# Patient Record
Sex: Female | Born: 1985 | Hispanic: No | Marital: Single | Smoking: Current every day smoker
Health system: Southern US, Community
[De-identification: ages and names within clinical notes are randomized; demographics above are authoritative.]

## PROBLEM LIST (undated history)

## (undated) DIAGNOSIS — K219 Gastro-esophageal reflux disease without esophagitis: Secondary | ICD-10-CM

## (undated) DIAGNOSIS — A599 Trichomoniasis, unspecified: Secondary | ICD-10-CM

## (undated) DIAGNOSIS — F319 Bipolar disorder, unspecified: Secondary | ICD-10-CM

## (undated) DIAGNOSIS — R87629 Unspecified abnormal cytological findings in specimens from vagina: Secondary | ICD-10-CM

## (undated) DIAGNOSIS — F419 Anxiety disorder, unspecified: Secondary | ICD-10-CM

## (undated) DIAGNOSIS — F32A Depression, unspecified: Secondary | ICD-10-CM

## (undated) DIAGNOSIS — G43909 Migraine, unspecified, not intractable, without status migrainosus: Secondary | ICD-10-CM

## (undated) DIAGNOSIS — A749 Chlamydial infection, unspecified: Secondary | ICD-10-CM

## (undated) DIAGNOSIS — U071 COVID-19: Secondary | ICD-10-CM

## (undated) HISTORY — PX: COLPOSCOPY: SHX161

## (undated) HISTORY — PX: TUBAL LIGATION: SHX77

---

## 2008-09-24 ENCOUNTER — Emergency Department (HOSPITAL_BASED_OUTPATIENT_CLINIC_OR_DEPARTMENT_OTHER): Admission: EM | Admit: 2008-09-24 | Discharge: 2008-09-24 | Payer: Self-pay | Admitting: Emergency Medicine

## 2008-12-31 ENCOUNTER — Emergency Department (HOSPITAL_BASED_OUTPATIENT_CLINIC_OR_DEPARTMENT_OTHER): Admission: EM | Admit: 2008-12-31 | Discharge: 2009-01-01 | Payer: Self-pay | Admitting: Emergency Medicine

## 2009-01-06 ENCOUNTER — Inpatient Hospital Stay (HOSPITAL_COMMUNITY): Admission: AD | Admit: 2009-01-06 | Discharge: 2009-01-06 | Payer: Self-pay | Admitting: Obstetrics & Gynecology

## 2010-04-24 LAB — URINALYSIS, ROUTINE W REFLEX MICROSCOPIC
Bilirubin Urine: NEGATIVE
Glucose, UA: NEGATIVE mg/dL
Hgb urine dipstick: NEGATIVE
Specific Gravity, Urine: 1.02 (ref 1.005–1.030)
pH: 6.5 (ref 5.0–8.0)

## 2010-04-25 LAB — CBC
MCV: 91.9 fL (ref 78.0–100.0)
Platelets: 235 10*3/uL (ref 150–400)
RBC: 3.53 MIL/uL — ABNORMAL LOW (ref 3.87–5.11)
WBC: 12.5 10*3/uL — ABNORMAL HIGH (ref 4.0–10.5)

## 2010-04-25 LAB — PREGNANCY, URINE: Preg Test, Ur: POSITIVE

## 2010-04-25 LAB — HCG, QUANTITATIVE, PREGNANCY: hCG, Beta Chain, Quant, S: 7431 m[IU]/mL — ABNORMAL HIGH (ref <5)

## 2010-04-25 LAB — DIFFERENTIAL
Eosinophils Absolute: 0.2 10*3/uL (ref 0.0–0.7)
Lymphocytes Relative: 27 % (ref 12–46)
Lymphs Abs: 3.4 10*3/uL (ref 0.7–4.0)
Monocytes Relative: 8 % (ref 3–12)
Neutrophils Relative %: 63 % (ref 43–77)

## 2010-04-25 LAB — WET PREP, GENITAL

## 2010-04-25 LAB — URINALYSIS, ROUTINE W REFLEX MICROSCOPIC
Glucose, UA: NEGATIVE mg/dL
Ketones, ur: NEGATIVE mg/dL
Nitrite: NEGATIVE
Protein, ur: NEGATIVE mg/dL
pH: 7 (ref 5.0–8.0)

## 2010-04-25 LAB — URINE MICROSCOPIC-ADD ON

## 2010-04-25 LAB — GC/CHLAMYDIA PROBE AMP, GENITAL: Chlamydia, DNA Probe: NEGATIVE

## 2010-04-28 LAB — RAPID STREP SCREEN (MED CTR MEBANE ONLY): Streptococcus, Group A Screen (Direct): NEGATIVE

## 2010-06-20 ENCOUNTER — Emergency Department (HOSPITAL_BASED_OUTPATIENT_CLINIC_OR_DEPARTMENT_OTHER)
Admission: EM | Admit: 2010-06-20 | Discharge: 2010-06-20 | Disposition: A | Discharge status: Home / Self Care | Payer: No Typology Code available for payment source | Attending: Emergency Medicine | Admitting: Emergency Medicine

## 2010-06-20 DIAGNOSIS — Y92410 Unspecified street and highway as the place of occurrence of the external cause: Secondary | ICD-10-CM | POA: Insufficient documentation

## 2010-06-20 DIAGNOSIS — M549 Dorsalgia, unspecified: Secondary | ICD-10-CM | POA: Insufficient documentation

## 2010-06-20 DIAGNOSIS — M542 Cervicalgia: Secondary | ICD-10-CM | POA: Insufficient documentation

## 2010-12-16 ENCOUNTER — Emergency Department (HOSPITAL_BASED_OUTPATIENT_CLINIC_OR_DEPARTMENT_OTHER)
Admission: EM | Admit: 2010-12-16 | Discharge: 2010-12-16 | Disposition: A | Discharge status: Home / Self Care | Payer: Medicaid Other | Attending: Emergency Medicine | Admitting: Emergency Medicine

## 2010-12-16 ENCOUNTER — Encounter: Payer: Self-pay | Admitting: *Deleted

## 2010-12-16 DIAGNOSIS — J069 Acute upper respiratory infection, unspecified: Secondary | ICD-10-CM

## 2010-12-16 LAB — RAPID STREP SCREEN (MED CTR MEBANE ONLY): Streptococcus, Group A Screen (Direct): NEGATIVE

## 2010-12-16 NOTE — ED Provider Notes (Signed)
History    Scribed for Nat Christen, MD, the patient was seen in room MH05/MH05. This chart was scribed by Katha Cabal.   CSN: [id number] Arrival date & time: 12/16/2010  6:48 PM   First MD Initiated Contact with Patient 12/16/10 1932      Chief Complaint  Patient presents with  . Influenza    (Consider location/radiation/quality/duration/timing/severity/associated sxs/prior treatment) Patient is a 25 y.o. female presenting with flu symptoms. The history is provided by the patient. No language interpreter was used.  Influenza This is a new problem. The current episode started yesterday. The problem occurs constantly. The problem has been gradually worsening. Pertinent negatives include no shortness of breath. The symptoms are relieved by nothing. She has tried rest for the symptoms. The treatment provided no relief.  Patient reports congestion, fever, myalgia and difficulty swallowing since last night.  Patient reports sick son at home with similar symptoms that was diagnosed with influenza.  There is no history of serious medical conditions.    History reviewed. No pertinent past medical history.  Past Surgical History  Procedure Date  . Cesarean section     History reviewed. No pertinent family history.  History  Substance Use Topics  . Smoking status: Not on file  . Smokeless tobacco: Not on file  . Alcohol Use:     OB History    Grav Para Term Preterm Abortions TAB SAB Ect Mult Living                  Review of Systems  Constitutional: Positive for fever.  HENT: Positive for congestion and trouble swallowing.   Respiratory: Positive for cough. Negative for shortness of breath.   Musculoskeletal: Positive for myalgias.  All other systems reviewed and are negative.    Allergies  Percocet  Home Medications   Current Outpatient Rx  Name Route Sig Dispense Refill  . DEXTROMETHORPHAN POLISTIREX 30 MG/5ML PO LQCR Oral Take 60 mg by mouth once.      .  ETONOGESTREL 68 MG [place] IMPL Subcutaneous Inject 1 each into the skin once.        BP 129/69  Pulse 94  Temp(Src) 99.8 F (37.7 C) (Oral)  Resp 20  Ht 5\' 5"  (1.651 m)  Wt 215 lb (97.523 kg)  BMI 35.78 kg/m2  SpO2 97%  Physical Exam  Constitutional: She is oriented to person, place, and time. She appears well-developed and well-nourished.  Non-toxic appearance. She does not have a sickly appearance. No distress.  HENT:  Head: Normocephalic and atraumatic.  Mouth/Throat: Posterior oropharyngeal erythema present.       Bilaterally enlarged erythematous tonsils with no frank exudates   Eyes: Conjunctivae, EOM and lids are normal. Pupils are equal, round, and reactive to light. No scleral icterus.  Neck: Trachea normal and normal range of motion. Neck supple.  Cardiovascular: Normal rate, regular rhythm and normal heart sounds.   No murmur heard. Pulmonary/Chest: Effort normal and breath sounds normal. No respiratory distress.  Abdominal: Soft. Normal appearance. There is no tenderness. There is no rebound, no guarding and no CVA tenderness.  Musculoskeletal: Normal range of motion.  Neurological: She is alert and oriented to person, place, and time. She has normal strength.  Skin: Skin is warm, dry and intact. No rash noted.    ED Course  Procedures (including critical care time)   DIAGNOSTIC STUDIES: Oxygen Saturation is 97% on room air, normal by my interpretation.    COORDINATION OF CARE:  7:56 PM  Physical exam complete.  Will order rapid strep.      LABS / RADIOLOGY:    Labs Reviewed  RAPID STREP SCREEN   Results for orders placed during the hospital encounter of 12/16/10  RAPID STREP SCREEN      Component Value Range   Streptococcus, Group A Screen (Direct) NEGATIVE  NEGATIVE     No results found.       MDM   MDM: Patient with likely viral infection as cause for her symptoms of cough, nausea, myalgias and subjective fevers.  Patient's throat was  erythematous somewhat swollen tonsils but strep screen is negative.  Patient otherwise appears well and is able to tolerate by mouth and I feel safe for discharge home.     MEDICATIONS GIVEN IN THE E.D. Scheduled Meds:   Continuous Infusions:       IMPRESSION: No diagnosis found.   DISCHARGE MEDICATIONS: New Prescriptions   No medications on file      I personally performed the services described in this documentation, which was scribed in my presence. The recorded information has been reviewed and considered.             Nat Christen, MD 12/16/10 [phone]

## 2010-12-16 NOTE — ED Notes (Signed)
Pt states her son was recently dx'd with the flu and she has had cough, body aches, fever since yesterday

## 2011-08-08 ENCOUNTER — Emergency Department (HOSPITAL_BASED_OUTPATIENT_CLINIC_OR_DEPARTMENT_OTHER)
Admission: EM | Admit: 2011-08-08 | Discharge: 2011-08-08 | Disposition: A | Discharge status: Home / Self Care | Payer: Medicaid Other | Attending: Emergency Medicine | Admitting: Emergency Medicine

## 2011-08-08 ENCOUNTER — Encounter (HOSPITAL_BASED_OUTPATIENT_CLINIC_OR_DEPARTMENT_OTHER): Payer: Self-pay | Admitting: *Deleted

## 2011-08-08 DIAGNOSIS — F17200 Nicotine dependence, unspecified, uncomplicated: Secondary | ICD-10-CM | POA: Insufficient documentation

## 2011-08-08 DIAGNOSIS — J029 Acute pharyngitis, unspecified: Secondary | ICD-10-CM | POA: Insufficient documentation

## 2011-08-08 LAB — RAPID STREP SCREEN (MED CTR MEBANE ONLY): Streptococcus, Group A Screen (Direct): NEGATIVE

## 2011-08-08 MED ORDER — CEFTRIAXONE SODIUM 250 MG IJ SOLR
INTRAMUSCULAR | Status: AC
  Filled 2011-08-08: qty 250

## 2011-08-08 MED ORDER — CEFTRIAXONE SODIUM 250 MG IJ SOLR
250.0000 mg | Freq: Once | INTRAMUSCULAR | Status: AC
  Administered 2011-08-08: 250 mg via INTRAMUSCULAR

## 2011-08-08 MED ORDER — AZITHROMYCIN 250 MG PO TABS: 500.0000 mg | ORAL_TABLET | Freq: Once | ORAL | Status: DC

## 2011-08-08 MED ORDER — AZITHROMYCIN 250 MG PO TABS: 250.0000 mg | ORAL_TABLET | Freq: Every day | ORAL | Status: AC

## 2011-08-08 MED ORDER — AZITHROMYCIN 250 MG PO TABS
500.0000 mg | ORAL_TABLET | Freq: Every day | ORAL | Status: DC
  Administered 2011-08-08: 500 mg via ORAL
  Filled 2011-08-08: qty 2

## 2011-08-08 NOTE — ED Notes (Signed)
Pt informed this RN, sexual partner was just dx with gonorrhea. MD made aware, order for rocephin received.

## 2011-08-08 NOTE — ED Notes (Signed)
Pt c/o sore throat x4days.

## 2011-08-08 NOTE — ED Provider Notes (Addendum)
History     CSN: [id number]  Arrival date & time 08/08/11  8657   First MD Initiated Contact with Patient 08/08/11 2002      Chief Complaint  Patient presents with  . Sore Throat    (Consider location/radiation/quality/duration/timing/severity/associated sxs/prior treatment) Patient is a 26 y.o. female presenting with pharyngitis. The history is provided by the patient.  Sore Throat This is a new problem. Episode onset: 4 days ago. The problem occurs constantly. The problem has not changed since onset.Pertinent negatives include no abdominal pain. Associated symptoms comments: No fever or cough. The symptoms are aggravated by swallowing. Nothing relieves the symptoms. She has tried nothing for the symptoms. The treatment provided no relief.    History reviewed. No pertinent past medical history.  Past Surgical History  Procedure Date  . Cesarean section   . Cesarean section     History reviewed. No pertinent family history.  History  Substance Use Topics  . Smoking status: Current Everyday Smoker  . Smokeless tobacco: Not on file  . Alcohol Use: No    OB History    Grav Para Term Preterm Abortions TAB SAB Ect Mult Living                  Review of Systems  Gastrointestinal: Negative for abdominal pain.  All other systems reviewed and are negative.    Allergies  Percocet  Home Medications   Current Outpatient Rx  Name Route Sig Dispense Refill  . DEXTROMETHORPHAN POLISTIREX ER 30 MG/5ML PO LQCR Oral Take 60 mg by mouth once.      . ETONOGESTREL 68 MG [place] IMPL Subcutaneous Inject 1 each into the skin once.        BP 122/63  Pulse 72  Temp 98.2 F (36.8 C) (Oral)  Resp 16  Ht 5\' 5"  (1.651 m)  Wt 230 lb (104.327 kg)  BMI 38.27 kg/m2  SpO2 100%  Physical Exam  Nursing note and vitals reviewed. Constitutional: She is oriented to person, place, and time. She appears well-developed and well-nourished. No distress.  HENT:  Head: Normocephalic and  atraumatic.  Mouth/Throat: Mucous membranes are normal. Oropharyngeal exudate, posterior oropharyngeal edema and posterior oropharyngeal erythema present. No tonsillar abscesses.  Eyes: EOM are normal. Pupils are equal, round, and reactive to light.  Neck: Normal range of motion. Neck supple.  Cardiovascular: Normal rate, regular rhythm, normal heart sounds and intact distal pulses.  Exam reveals no friction rub.   No murmur heard. Pulmonary/Chest: Effort normal and breath sounds normal. She has no wheezes. She has no rales.  Musculoskeletal: Normal range of motion. She exhibits no tenderness.       No edema  Lymphadenopathy:    She has cervical adenopathy.  Neurological: She is alert and oriented to person, place, and time. No cranial nerve deficit.  Skin: Skin is warm and dry. No rash noted.  Psychiatric: She has a normal mood and affect. Her behavior is normal.    ED Course  Procedures (including critical care time)   Labs Reviewed  RAPID STREP SCREEN   No results found.   1. Pharyngitis       MDM   Patient with pharyngitis with enlarged tonsils with exudate. Patient has 3 of 4 Centor criteria is in no acute distress. Rapid strep was negative however given patient's symptoms throat culture was sent and patient was placed on azithromycin. No signs concerning for retropharyngeal abscess, peritonsillar abscess, epiglottitis.   8:16 PM Pt states boyfriend recently  dx with gonorrhea.  Pt is assymtpomatic but will give 250 of rocephin     Gwyneth Sprout, MD 08/08/11 2009  Gwyneth Sprout, MD 08/08/11 2016

## 2011-08-09 LAB — STREP A DNA PROBE

## 2011-09-18 ENCOUNTER — Encounter (HOSPITAL_BASED_OUTPATIENT_CLINIC_OR_DEPARTMENT_OTHER): Payer: Self-pay | Admitting: Emergency Medicine

## 2011-09-18 ENCOUNTER — Emergency Department (HOSPITAL_BASED_OUTPATIENT_CLINIC_OR_DEPARTMENT_OTHER)
Admission: EM | Admit: 2011-09-18 | Discharge: 2011-09-18 | Disposition: A | Discharge status: Home / Self Care | Payer: Medicaid Other | Attending: Emergency Medicine | Admitting: Emergency Medicine

## 2011-09-18 DIAGNOSIS — R070 Pain in throat: Secondary | ICD-10-CM | POA: Insufficient documentation

## 2011-09-18 DIAGNOSIS — J029 Acute pharyngitis, unspecified: Secondary | ICD-10-CM

## 2011-09-18 DIAGNOSIS — F17200 Nicotine dependence, unspecified, uncomplicated: Secondary | ICD-10-CM | POA: Insufficient documentation

## 2011-09-18 MED ORDER — IBUPROFEN 800 MG PO TABS
800.0000 mg | ORAL_TABLET | Freq: Once | ORAL | Status: AC
  Administered 2011-09-18: 800 mg via ORAL
  Filled 2011-09-18: qty 1

## 2011-09-18 NOTE — ED Provider Notes (Signed)
History     CSN: [id number]  Arrival date & time 09/18/11  2130   First MD Initiated Contact with Patient 09/18/11 [phone]      Chief Complaint  Patient presents with  . Sore Throat    (Consider location/radiation/quality/duration/timing/severity/associated sxs/prior treatment) The history is provided by the patient.  Lisa Wright is a 26 y.o. female hx of STD here with sore throat. Sore throat since yesterday, felt warm but didn't check temp. + cough and sinus congestion. No vomiting or chest pain or abdominal pain. Was here a month ago for same symptoms. At that time, she was treated for STD with ceftriaxone, azithro. She now has a new sexual partner but doesn't know if he has STDs. She denies vaginal pain or discharge.    History reviewed. No pertinent past medical history.  Past Surgical History  Procedure Date  . Cesarean section   . Cesarean section     No family history on file.  History  Substance Use Topics  . Smoking status: Current Everyday Smoker  . Smokeless tobacco: Not on file  . Alcohol Use: No    OB History    Grav Para Term Preterm Abortions TAB SAB Ect Mult Living                  Review of Systems  HENT: Positive for congestion and sore throat.   Respiratory: Positive for cough.     Allergies  Percocet  Home Medications   Current Outpatient Rx  Name Route Sig Dispense Refill  . DEXTROMETHORPHAN POLISTIREX ER 30 MG/5ML PO LQCR Oral Take 60 mg by mouth once.      . ETONOGESTREL 68 MG [place] IMPL Subcutaneous Inject 1 each into the skin once.        BP 142/69  Pulse 78  Temp 98.6 F (37 C) (Oral)  Resp 20  SpO2 99%  Physical Exam  Nursing note and vitals reviewed. Constitutional: She is oriented to person, place, and time. She appears well-developed and well-nourished.       Calm  HENT:  Head: Normocephalic.       + bilateral tonsils enlarged with exudates. OP otherwise clear. No cervical lymphadenopathy.   Neck: Normal range of motion.  Neck supple.  Cardiovascular: Normal rate, regular rhythm and normal heart sounds.   Pulmonary/Chest: Effort normal and breath sounds normal.  Abdominal: Soft. Bowel sounds are normal.  Musculoskeletal: Normal range of motion.  Neurological: She is alert and oriented to person, place, and time.  Skin: Skin is warm and dry.  Psychiatric: She has a normal mood and affect. Her behavior is normal. Judgment and thought content normal.    ED Course  Procedures (including critical care time)   Labs Reviewed  RAPID STREP SCREEN  STREP A DNA PROBE   No results found.   1. Sore throat       MDM  Lisa Wright is a 26 y.o. female here with sore throat. She has 2/4 Centor criteria. Will check rapid strep test. If negative, recommend NSAIDs, rest, fluids.   11:26 AM Patient's rapid strep negative. Throat culture sent. Will hold off of abx for now. Return if fever, worse sore throat. Recommend NSAIDs, fluids.        Richardean Canal, MD 09/18/11 1126

## 2011-09-18 NOTE — ED Notes (Addendum)
Pt c/o sore throat. States that its "swelled up".  Says she was treated here before for gonorrhea with the same symptoms.   Denies cough, trouble swallowing, breathing difficulties, or cold symptoms

## 2011-09-19 LAB — STREP A DNA PROBE: Group A Strep Probe: NEGATIVE

## 2012-01-24 ENCOUNTER — Encounter (HOSPITAL_BASED_OUTPATIENT_CLINIC_OR_DEPARTMENT_OTHER): Payer: Self-pay | Admitting: *Deleted

## 2012-01-24 DIAGNOSIS — Z3202 Encounter for pregnancy test, result negative: Secondary | ICD-10-CM | POA: Insufficient documentation

## 2012-01-24 DIAGNOSIS — Z79899 Other long term (current) drug therapy: Secondary | ICD-10-CM | POA: Insufficient documentation

## 2012-01-24 DIAGNOSIS — F17200 Nicotine dependence, unspecified, uncomplicated: Secondary | ICD-10-CM | POA: Insufficient documentation

## 2012-01-24 DIAGNOSIS — N72 Inflammatory disease of cervix uteri: Secondary | ICD-10-CM | POA: Insufficient documentation

## 2012-01-24 LAB — PREGNANCY, URINE: Preg Test, Ur: NEGATIVE

## 2012-01-24 LAB — URINALYSIS, ROUTINE W REFLEX MICROSCOPIC
Bilirubin Urine: NEGATIVE
Nitrite: NEGATIVE
Specific Gravity, Urine: 1.028 (ref 1.005–1.030)
Urobilinogen, UA: 1 mg/dL (ref 0.0–1.0)
pH: 7 (ref 5.0–8.0)

## 2012-01-24 LAB — URINE MICROSCOPIC-ADD ON

## 2012-01-24 NOTE — ED Notes (Signed)
Vaginal pain after having sex tonight. Burning and itching.

## 2012-01-25 ENCOUNTER — Emergency Department (HOSPITAL_BASED_OUTPATIENT_CLINIC_OR_DEPARTMENT_OTHER)
Admission: EM | Admit: 2012-01-25 | Discharge: 2012-01-25 | Disposition: A | Discharge status: Home / Self Care | Payer: Self-pay | Attending: Emergency Medicine | Admitting: Emergency Medicine

## 2012-01-25 DIAGNOSIS — N72 Inflammatory disease of cervix uteri: Secondary | ICD-10-CM

## 2012-01-25 LAB — WET PREP, GENITAL: Trich, Wet Prep: NONE SEEN

## 2012-01-25 LAB — GC/CHLAMYDIA PROBE AMP: CT Probe RNA: NEGATIVE

## 2012-01-25 MED ORDER — AZITHROMYCIN 1 G PO PACK
1.0000 g | PACK | Freq: Once | ORAL | Status: AC
  Administered 2012-01-25: 1 g via ORAL
  Filled 2012-01-25: qty 1

## 2012-01-25 MED ORDER — CEFTRIAXONE SODIUM 250 MG IJ SOLR
250.0000 mg | Freq: Once | INTRAMUSCULAR | Status: AC
  Administered 2012-01-25: 250 mg via INTRAMUSCULAR
  Filled 2012-01-25: qty 250

## 2012-01-25 NOTE — ED Provider Notes (Signed)
History     CSN: [id number]  Arrival date & time 01/24/12  2233   First MD Initiated Contact with Patient 01/25/12 0114      Chief Complaint  Patient presents with  . Vaginal Pain    (Consider location/radiation/quality/duration/timing/severity/associated sxs/prior treatment) Patient is a 27 y.o. female presenting with vaginal pain and vaginal discharge. The history is provided by the patient. No language interpreter was used.  Vaginal Pain This is a new problem. The current episode started more than 2 days ago. The problem occurs constantly. The problem has not changed since onset.Pertinent negatives include no chest pain, no abdominal pain, no headaches and no shortness of breath. Nothing aggravates the symptoms. She has tried nothing for the symptoms. The treatment provided no relief.  Vaginal Discharge This is a new problem. The current episode started more than 2 days ago. The problem occurs constantly. The problem has not changed since onset.Pertinent negatives include no chest pain, no abdominal pain, no headaches and no shortness of breath. Nothing aggravates the symptoms. Nothing relieves the symptoms. She has tried nothing for the symptoms. The treatment provided no relief.    History reviewed. No pertinent past medical history.  Past Surgical History  Procedure Date  . Cesarean section   . Cesarean section     No family history on file.  History  Substance Use Topics  . Smoking status: Current Every Day Smoker  . Smokeless tobacco: Not on file  . Alcohol Use: No    OB History    Grav Para Term Preterm Abortions TAB SAB Ect Mult Living                  Review of Systems  Respiratory: Negative for shortness of breath.   Cardiovascular: Negative for chest pain.  Gastrointestinal: Negative for abdominal pain.  Genitourinary: Positive for vaginal discharge and vaginal pain.  Neurological: Negative for headaches.  All other systems reviewed and are  negative.    Allergies  Percocet  Home Medications   Current Outpatient Rx  Name  Route  Sig  Dispense  Refill  . DEXTROMETHORPHAN POLISTIREX ER 30 MG/5ML PO LQCR   Oral   Take 60 mg by mouth once.           . ETONOGESTREL 68 MG [place] IMPL   Subcutaneous   Inject 1 each into the skin once.             BP 121/57  Pulse 62  Temp 98.2 F (36.8 C) (Oral)  Resp 18  SpO2 99%  Physical Exam  Constitutional: She is oriented to person, place, and time. She appears well-developed and well-nourished. No distress.  HENT:  Head: Normocephalic and atraumatic.  Mouth/Throat: No oropharyngeal exudate.  Eyes: Pupils are equal, round, and reactive to light.  Neck: Normal range of motion. Neck supple.  Cardiovascular: Normal rate and regular rhythm.   Pulmonary/Chest: Effort normal and breath sounds normal. She has no wheezes. She has no rales.  Abdominal: Soft. Bowel sounds are normal. There is no tenderness. There is no rebound and no guarding.  Genitourinary: Cervix exhibits no motion tenderness. Right adnexum displays no mass and no tenderness. Left adnexum displays no mass and no tenderness. Vaginal discharge found.       Chaperone present  Musculoskeletal: Normal range of motion.  Neurological: She is alert and oriented to person, place, and time.  Skin: Skin is warm and dry.  Psychiatric: She has a normal mood and affect.  ED Course  Procedures (including critical care time)  Labs Reviewed  URINALYSIS, ROUTINE W REFLEX MICROSCOPIC - Abnormal; Notable for the following:    Leukocytes, UA TRACE (*)     All other components within normal limits  URINE MICROSCOPIC-ADD ON - Abnormal; Notable for the following:    Squamous Epithelial / LPF FEW (*)     Bacteria, UA FEW (*)     All other components within normal limits  WET PREP, GENITAL - Abnormal; Notable for the following:    Clue Cells Wet Prep HPF POC FEW (*)     WBC, Wet Prep HPF POC TOO NUMEROUS TO COUNT (*)     All  other components within normal limits  PREGNANCY, URINE  GC/CHLAMYDIA PROBE AMP   No results found.   No diagnosis found.    MDM  No sexual activity until 7 days after all partners treated [facility]        Sy Saintjean Smitty Cords, MD 01/25/12 0302

## 2012-01-25 NOTE — ED Notes (Signed)
Pt states "they need to hurry up cause i got kids outside", pt made aware that EDP was working on her d/c papers at this time but she could leave if needed. Pt continues to stand in doorway awaiting d/c. NAD noted.

## 2012-09-11 ENCOUNTER — Encounter (HOSPITAL_BASED_OUTPATIENT_CLINIC_OR_DEPARTMENT_OTHER): Payer: Self-pay | Admitting: *Deleted

## 2012-09-11 ENCOUNTER — Emergency Department (HOSPITAL_BASED_OUTPATIENT_CLINIC_OR_DEPARTMENT_OTHER)
Admission: EM | Admit: 2012-09-11 | Discharge: 2012-09-11 | Disposition: A | Discharge status: Home / Self Care | Payer: Medicaid Other | Attending: Emergency Medicine | Admitting: Emergency Medicine

## 2012-09-11 DIAGNOSIS — J029 Acute pharyngitis, unspecified: Secondary | ICD-10-CM | POA: Insufficient documentation

## 2012-09-11 DIAGNOSIS — R0609 Other forms of dyspnea: Secondary | ICD-10-CM | POA: Insufficient documentation

## 2012-09-11 DIAGNOSIS — R0989 Other specified symptoms and signs involving the circulatory and respiratory systems: Secondary | ICD-10-CM | POA: Insufficient documentation

## 2012-09-11 DIAGNOSIS — Z79899 Other long term (current) drug therapy: Secondary | ICD-10-CM | POA: Insufficient documentation

## 2012-09-11 DIAGNOSIS — F17200 Nicotine dependence, unspecified, uncomplicated: Secondary | ICD-10-CM | POA: Insufficient documentation

## 2012-09-11 DIAGNOSIS — R070 Pain in throat: Secondary | ICD-10-CM | POA: Insufficient documentation

## 2012-09-11 MED ORDER — AZITHROMYCIN 250 MG PO TABS
500.0000 mg | ORAL_TABLET | Freq: Once | ORAL | Status: AC
  Administered 2012-09-11: 500 mg via ORAL
  Filled 2012-09-11: qty 2

## 2012-09-11 MED ORDER — CEFTRIAXONE SODIUM 250 MG IJ SOLR
250.0000 mg | Freq: Once | INTRAMUSCULAR | Status: AC
Start: 2012-09-11 — End: 2012-09-11
  Administered 2012-09-11: 250 mg via INTRAMUSCULAR
  Filled 2012-09-11: qty 250

## 2012-09-11 NOTE — ED Provider Notes (Signed)
CSN: [id number]     Arrival date & time 09/11/12  1315 History     First MD Initiated Contact with Patient 09/11/12 1343     Chief Complaint  Patient presents with  . Sore Throat   (Consider location/radiation/quality/duration/timing/severity/associated sxs/prior Treatment) HPI Comments: 2 day history of sore throat after performing oral sex on a new partner. Some pain with swallowing. No fevers, vomiting, abdominal pain. No difficulty breathing or swallowing. No cough, congestion, ear pain. Good by mouth intake and urine output. Unknown if exposed to STDs.  The history is provided by the patient.    History reviewed. No pertinent past medical history. Past Surgical History  Procedure Laterality Date  . Cesarean section    . Cesarean section     No family history on file. History  Substance Use Topics  . Smoking status: Current Every Day Smoker  . Smokeless tobacco: Not on file  . Alcohol Use: No   OB History   Grav Para Term Preterm Abortions TAB SAB Ect Mult Living                 Review of Systems  Constitutional: Negative for fever, activity change and appetite change.  HENT: Positive for sore throat. Negative for congestion, rhinorrhea and trouble swallowing.   Respiratory: Negative for cough and shortness of breath.   Cardiovascular: Negative for chest pain.  Gastrointestinal: Negative for nausea, vomiting and abdominal pain.  Genitourinary: Negative for dysuria and hematuria.  Musculoskeletal: Negative for back pain.  Skin: Negative for wound.  Neurological: Negative for headaches.  A complete 10 system review of systems was obtained and all systems are negative except as noted in the HPI and PMH.    Allergies  Percocet  Home Medications   Current Outpatient Rx  Name  Route  Sig  Dispense  Refill  . dextromethorphan (DELSYM) 30 MG/5ML liquid   Oral   Take 60 mg by mouth once.           . etonogestrel (IMPLANON) 68 MG IMPL implant   Subcutaneous  Inject 1 each into the skin once.            BP 135/82  Pulse 66  Temp(Src) 98.1 F (36.7 C) (Oral)  Resp 17  Ht 5\' 5"  (1.651 m)  Wt 233 lb (105.688 kg)  BMI 38.77 kg/m2  SpO2 100%  LMP 07/26/2012 Physical Exam  Constitutional: She is oriented to person, place, and time. She appears well-developed and well-nourished. No distress.  HENT:  Head: Normocephalic and atraumatic.  Right Ear: External ear normal.  Left Ear: External ear normal.  Mouth/Throat: Oropharynx is clear and moist. No oropharyngeal exudate.  Enlarged tonsils bilaterally. No exudates. Uvula midline. No trismus  Eyes: Conjunctivae and EOM are normal. Pupils are equal, round, and reactive to light.  Neck: Normal range of motion. Neck supple.  No meningismus  Cardiovascular: Normal rate, regular rhythm and normal heart sounds.   No murmur heard. Pulmonary/Chest: Breath sounds normal. She is in respiratory distress.  Abdominal: There is no tenderness. There is no rebound and no guarding.  Musculoskeletal: Normal range of motion. She exhibits edema. She exhibits no tenderness.  Lymphadenopathy:    She has no cervical adenopathy.  Neurological: She is alert and oriented to person, place, and time. No cranial nerve deficit. She exhibits normal muscle tone. Coordination normal.  Skin: Skin is warm.    ED Course   Procedures (including critical care time)  Labs Reviewed - No data  to display No results found. 1. Sore throat     MDM  Throat pain after performing oral sex.  No fever or vomiting. Controlling secretions.  We'll treat empirically for STDs with Rocephin and azithromycin.  Glynn Octave, MD 09/11/12 [phone]

## 2012-09-11 NOTE — ED Notes (Signed)
Pt. Reports she was performing oral sex on a new partner and now she is having throat pain and burning with swallowing.  Noted large tonsils with some redness noted.

## 2012-10-07 ENCOUNTER — Ambulatory Visit: Payer: Self-pay | Admitting: Advanced Practice Midwife

## 2012-11-14 ENCOUNTER — Ambulatory Visit: Payer: Self-pay | Admitting: Advanced Practice Midwife

## 2012-11-20 ENCOUNTER — Ambulatory Visit (INDEPENDENT_AMBULATORY_CARE_PROVIDER_SITE_OTHER): Payer: Medicaid Other | Admitting: Obstetrics & Gynecology

## 2012-11-20 ENCOUNTER — Encounter: Payer: Self-pay | Admitting: Obstetrics & Gynecology

## 2012-11-20 VITALS — BP 108/74 | HR 56 | Temp 98.7°F | Ht 66.0 in | Wt 253.0 lb

## 2012-11-20 DIAGNOSIS — Z01419 Encounter for gynecological examination (general) (routine) without abnormal findings: Secondary | ICD-10-CM

## 2012-11-20 DIAGNOSIS — Z23 Encounter for immunization: Secondary | ICD-10-CM

## 2012-11-20 DIAGNOSIS — Z0000 Encounter for general adult medical examination without abnormal findings: Secondary | ICD-10-CM

## 2012-11-20 LAB — POCT URINALYSIS DIPSTICK: Spec Grav, UA: 1.015

## 2012-11-20 LAB — HEMOGLOBIN A1C: Mean Plasma Glucose: 114 mg/dL (ref <117)

## 2012-11-20 NOTE — Progress Notes (Signed)
Subjective:     Lisa Wright is a 27 y.o. female here for a routine exam.  Current complaints: annual exam. Pt states her Nexplanon is due to come out in November 28, 2012. Pt is interested in having another Nexplanon. Pt states she is having issues with acid reflux. Pt states she has tried OTC methods with no relief.  Personal health questionnaire reviewed: yes.   Gynecologic History No LMP recorded. Patient has had an implant. Contraception: Nexplanon Last Pap: unsure of date. Results were: normal   Obstetric History OB History  No data available     The following portions of the patient's history were reviewed and updated as appropriate: allergies, current medications, past family history, past medical history, past social history, past surgical history and problem list.  Review of Systems Pertinent items are noted in HPI.    Objective:      General appearance: alert Breasts: normal appearance, no masses or tenderness Abdomen: soft, non-tender; bowel sounds normal; no masses,  no organomegaly Pelvic: cervix normal in appearance, external genitalia normal, no adnexal masses or tenderness, uterus normal size, shape, and consistency and vagina normal without discharge       Assessment:    Healthy female exam.    Plan:   Return in 1 week for Nexplanon removal/insertion

## 2012-11-20 NOTE — Patient Instructions (Signed)

## 2012-11-21 ENCOUNTER — Encounter: Payer: Self-pay | Admitting: Obstetrics

## 2012-11-21 DIAGNOSIS — Z23 Encounter for immunization: Secondary | ICD-10-CM

## 2012-11-21 NOTE — Addendum Note (Signed)
Addended by: George Hugh on: 11/21/2012 12:19 PM   Modules accepted: Orders, SmartSet

## 2012-11-24 LAB — PAP IG, CT-NG, RFX HPV ASCU: Chlamydia Probe Amp: NEGATIVE

## 2012-11-25 ENCOUNTER — Encounter: Payer: Self-pay | Admitting: Obstetrics & Gynecology

## 2012-11-25 DIAGNOSIS — IMO0002 Reserved for concepts with insufficient information to code with codable children: Secondary | ICD-10-CM | POA: Insufficient documentation

## 2012-11-25 LAB — HUMAN PAPILLOMAVIRUS, HIGH RISK: HPV DNA High Risk: DETECTED — AB

## 2012-11-26 ENCOUNTER — Ambulatory Visit: Payer: Medicaid Other | Admitting: Obstetrics & Gynecology

## 2012-12-01 ENCOUNTER — Encounter: Payer: Self-pay | Admitting: Obstetrics & Gynecology

## 2012-12-01 ENCOUNTER — Ambulatory Visit (INDEPENDENT_AMBULATORY_CARE_PROVIDER_SITE_OTHER): Payer: Medicaid Other | Admitting: Obstetrics & Gynecology

## 2012-12-01 VITALS — BP 108/65 | HR 52 | Temp 98.5°F | Ht 66.0 in | Wt 254.0 lb

## 2012-12-01 DIAGNOSIS — Z3202 Encounter for pregnancy test, result negative: Secondary | ICD-10-CM

## 2012-12-01 DIAGNOSIS — Z01818 Encounter for other preprocedural examination: Secondary | ICD-10-CM

## 2012-12-01 DIAGNOSIS — Z30017 Encounter for initial prescription of implantable subdermal contraceptive: Secondary | ICD-10-CM

## 2012-12-01 LAB — POCT URINE PREGNANCY: Preg Test, Ur: NEGATIVE

## 2012-12-01 NOTE — Progress Notes (Signed)
NEXPLANON REMOVAL   Reasons  for removal:  Appropriate time.   A timeout was performed confirming the patient, the procedure and allergy status. The patient's right  arm was palpated and the implant device located. The area was prepped with Betadinex3. The distal end of the device was palpated and 3 cc of 1% lidocaine was injected. A 3 mm incision was made. Any fibrotic tissue was carefully dissected away using blunt and/or sharp dissection. The device was removed in an intact manner.  New contraceptive method: see below  NEXPLANON INSERTION NOTE  Date of LMP:   Not bleeding at this time- patient states her Implanon has expired and she wants it removed and replaced.  Contraception used: *Nexplanon  Pregnancy test result:  Lab Results  Component Value Date   PREGTESTUR NEGATIVE 01/24/2012    Indications:  The patient desires contraception.  She understands risks, benefits, and alternatives to Implanon and would like to proceed.  Anesthesia:   Lidocaine 1% plain.  Procedure:  A time-out was performed confirming the procedure and the patient's allergy status.  The patient's non-dominant was identified as the right arm.  The protection cap was removed. While placing countertraction on the skin, the needle was inserted at a 30 degree angle.  The applicator was held horizontal to the skin; the skin was tented upward as the needle was introduced into the subdermal space.  While holding the applicator in place, the slider was unlocked. The Nexplanon was removed from the field.  The Nexplanon was palpated to ensure proper placement.  Please note that the implant was inserted in th above incision.  Complications: None  Instructions:  The patient was instructed to remove the dressing in 24 hours and that some bruising is to be expected.  She was advised to use over the counter analgesics as needed for any pain at the site.  She is to keep the area dry for 24 hours and to call if her hand or arm  becomes cold, numb, or blue.  Return visit:  Return in 6+ weeks

## 2012-12-02 ENCOUNTER — Encounter: Payer: Self-pay | Admitting: Obstetrics & Gynecology

## 2012-12-05 ENCOUNTER — Encounter: Payer: Self-pay | Admitting: Obstetrics

## 2012-12-05 ENCOUNTER — Ambulatory Visit (INDEPENDENT_AMBULATORY_CARE_PROVIDER_SITE_OTHER): Payer: Medicaid Other | Admitting: Obstetrics

## 2012-12-05 VITALS — BP 130/73 | HR 71 | Temp 98.4°F | Ht 66.0 in | Wt 254.0 lb

## 2012-12-05 DIAGNOSIS — N731 Chronic parametritis and pelvic cellulitis: Secondary | ICD-10-CM | POA: Insufficient documentation

## 2012-12-05 DIAGNOSIS — L0291 Cutaneous abscess, unspecified: Secondary | ICD-10-CM | POA: Insufficient documentation

## 2012-12-05 MED ORDER — AMOXICILLIN-POT CLAVULANATE 875-125 MG PO TABS: 1.0000 | ORAL_TABLET | Freq: Two times a day (BID) | ORAL | Status: DC

## 2012-12-05 MED ORDER — IBUPROFEN 800 MG PO TABS: 800.0000 mg | ORAL_TABLET | Freq: Three times a day (TID) | ORAL | Status: DC | PRN

## 2012-12-05 NOTE — Progress Notes (Addendum)
.   Subjective:     Lisa Wright is a 27 y.o. female here for a routine exam.  Current complaints: Patient had the Nexplanon removed and re- inserted on 12/01/2012.  The site is now red, painful and draining a white pus-like discharge.   Personal health questionnaire reviewed: yes.   Gynecologic History No LMP recorded. Patient has had an implant. Contraception: Nexplanon Last Pap: 11/2012. Results were: abnormal.  Colpo scheduled next month. Last mammogram: N/A  Obstetric History OB History  No data available     The following portions of the patient's history were reviewed and updated as appropriate: allergies, current medications, past family history, past medical history, past social history, past surgical history and problem list.  Review of Systems Pertinent items are noted in HPI.    Objective:     Right arm:  Nexplanon insertion site erythematous, indurated and warm to touch.  Pus draining from incision.                                             Procedure:                                 Suture cut and incision probed and the remainder of pus expelled.  The incision then cleaned with half strength peroxide and dressed.    Assessment:    Abscess of Nexplanon insertion site.  I&D of wound done.  Wound culture sent.   Plan:    Education reviewed: Management of abscesses.. Follow up in: 3 days. Augmentin Rx.

## 2012-12-08 ENCOUNTER — Encounter: Payer: Self-pay | Admitting: Obstetrics & Gynecology

## 2012-12-08 ENCOUNTER — Ambulatory Visit (INDEPENDENT_AMBULATORY_CARE_PROVIDER_SITE_OTHER): Payer: Medicaid Other | Admitting: Obstetrics & Gynecology

## 2012-12-08 VITALS — BP 95/62 | HR 62 | Temp 97.9°F | Ht 66.0 in | Wt 254.2 lb

## 2012-12-08 DIAGNOSIS — L0291 Cutaneous abscess, unspecified: Secondary | ICD-10-CM

## 2012-12-08 LAB — WOUND CULTURE
Gram Stain: NONE SEEN
Gram Stain: NONE SEEN

## 2012-12-08 NOTE — Progress Notes (Signed)
Subjective:     Lisa Wright is a 27 y.o. female here for a routine exam.  Current complaints: follow up. Pt states she is currently being treated for a skin abscess around her Nexplanon incision site.  Pt is currently taking Augmentin. Pt states she has no new concerns at this time.  Personal health questionnaire reviewed: yes.   Gynecologic History No LMP recorded. Patient has had an implant. Contraception: Nexplanon Last Pap: 11-2012. Results were: ASCUS Last mammogram: n/a Results were: n/a  Obstetric History OB History  No data available     The following portions of the patient's history were reviewed and updated as appropriate: allergies, current medications, past family history, past medical history, past social history, past surgical history and problem list.  Review of Systems Pertinent items are noted in HPI.    Objective:     RUE: Nexplanon site with induration; incision open; no drainage     Assessment:   Infection at Nexplanon insertion site--resolving  Plan:    Wound care; return in a few days

## 2012-12-10 ENCOUNTER — Ambulatory Visit (INDEPENDENT_AMBULATORY_CARE_PROVIDER_SITE_OTHER): Payer: Medicaid Other | Admitting: Obstetrics & Gynecology

## 2012-12-10 ENCOUNTER — Encounter: Payer: Self-pay | Admitting: Obstetrics & Gynecology

## 2012-12-10 VITALS — BP 108/66 | HR 64 | Temp 98.7°F | Ht 66.0 in | Wt 257.0 lb

## 2012-12-10 DIAGNOSIS — L0291 Cutaneous abscess, unspecified: Secondary | ICD-10-CM

## 2012-12-10 NOTE — Progress Notes (Signed)
Subjective:     Lisa Wright is a 27 y.o. female here for a wound follow up exam.  Current complaints: still having small amount of fluid with scant amount of yellow drainage, reports decreased redness swelling, denies pain .  Personal health questionnaire reviewed: yes.   Gynecologic History Patient's last menstrual period was 12/01/2012. Contraception: Nexplanon Last Pap:2014. Results were: abnormal Last mammogram: N/A  Obstetric History OB History  No data available     The following portions of the patient's history were reviewed and updated as appropriate: allergies, current medications, past family history, past medical history, past social history, past surgical history and problem list.  Review of Systems Pertinent items are noted in HPI.    Objective:     RUE: no purulent drainage; no palpable collection; small induration over the implant     Assessment:   Infection at Nexplanon insertion site--continues to improve  Plan:   Continue wound care Return next week or prn

## 2012-12-15 ENCOUNTER — Ambulatory Visit: Payer: Medicaid Other | Admitting: Obstetrics & Gynecology

## 2012-12-16 ENCOUNTER — Encounter: Payer: Self-pay | Admitting: Obstetrics & Gynecology

## 2012-12-16 ENCOUNTER — Ambulatory Visit (INDEPENDENT_AMBULATORY_CARE_PROVIDER_SITE_OTHER): Payer: Medicaid Other | Admitting: Obstetrics & Gynecology

## 2012-12-16 VITALS — BP 102/72 | HR 71 | Temp 98.1°F | Ht 66.0 in | Wt 253.0 lb

## 2012-12-16 DIAGNOSIS — L0291 Cutaneous abscess, unspecified: Secondary | ICD-10-CM

## 2012-12-16 NOTE — Progress Notes (Signed)
No evaluation

## 2012-12-24 ENCOUNTER — Ambulatory Visit (INDEPENDENT_AMBULATORY_CARE_PROVIDER_SITE_OTHER): Payer: Medicaid Other | Admitting: Obstetrics & Gynecology

## 2012-12-24 DIAGNOSIS — Z01812 Encounter for preprocedural laboratory examination: Secondary | ICD-10-CM

## 2012-12-24 DIAGNOSIS — R87610 Atypical squamous cells of undetermined significance on cytologic smear of cervix (ASC-US): Secondary | ICD-10-CM

## 2012-12-24 DIAGNOSIS — Z3202 Encounter for pregnancy test, result negative: Secondary | ICD-10-CM

## 2012-12-24 LAB — POCT URINE PREGNANCY: Preg Test, Ur: NEGATIVE

## 2012-12-24 NOTE — Progress Notes (Signed)
  Colposcopy Procedure Note  Indications: Pap smear 1 months ago showed: atypical squamous cellularity of undetermined significance (ASCUS). The prior pap showed no abnormalities.Prior pap performed May 2014 at [facility] was negative for abnormalities.  Prior cervical/vaginal disease: normal exam without visible pathology. Prior cervical treatment: colposcopy about 7 years ago.  Procedure Details  The risks and benefits of the procedure and Written informed consent obtained.  Speculum placed in vagina and excellent visualization of cervix achieved, cervix swabbed x 3 with acetic acid solution.     Specimens: Cervical biopsies/ECC  Complications: none.  Plan: Return to discuss Pathology results in 2 weeks.

## 2012-12-25 ENCOUNTER — Encounter: Payer: Self-pay | Admitting: Obstetrics & Gynecology

## 2012-12-26 NOTE — Patient Instructions (Signed)
Colposcopy Care After Colposcopy is a procedure in which a special tool is used to magnify the surface of the cervix. A tissue sample (biopsy) may also be taken. This sample will be looked at for cervical cancer or other problems. After the test:  You may have some cramping.  Lie down for a few minutes if you feel lightheaded.   You may have some bleeding which should stop in a few days. HOME CARE  Do not have sex or use tampons for 2 to 3 days or as told.  Only take medicine as told by your doctor.  Continue to take your birth control pills as usual. Finding out the results of your test Ask when your test results will be ready. Make sure you get your test results. GET HELP RIGHT AWAY IF:  You are bleeding a lot or are passing blood clots.  You develop a fever of 102 F (38.9 C) or higher.  You have abnormal vaginal discharge.  You have cramps that do not go away with medicine.  You feel lightheaded, dizzy, or pass out (faint). MAKE SURE YOU:   Understand these instructions.  Will watch your condition.  Will get help right away if you are not doing well or get worse. Document Released: 06/27/2007 Document Revised: 04/02/2011 Document Reviewed: 08/07/2012 [employer] Patient Information 2014 [employer].  

## 2012-12-26 NOTE — Progress Notes (Signed)
   Subjective:    Patient ID: Lisa Wright, female    DOB: Jun 03, 1985, 27 y.o.   MRN: [id number]  HPI    Review of Systems     Objective:   Physical Exam  Genitourinary:            Assessment & Plan:

## 2013-01-07 ENCOUNTER — Encounter: Payer: Self-pay | Admitting: Obstetrics & Gynecology

## 2013-01-07 ENCOUNTER — Ambulatory Visit (INDEPENDENT_AMBULATORY_CARE_PROVIDER_SITE_OTHER): Payer: Medicaid Other | Admitting: Obstetrics & Gynecology

## 2013-01-07 DIAGNOSIS — Z3009 Encounter for other general counseling and advice on contraception: Secondary | ICD-10-CM

## 2013-01-07 NOTE — Progress Notes (Signed)
Subjective:     Lisa Wright is a 27 y.o. female here for a follow up exam.  Current complaints: pt is in office today for f/u from colposcopy on 12/24/12.  Personal health questionnaire reviewed: yes.   Gynecologic History Patient's last menstrual period was 12/01/2012. Contraception: Nexplanon Last Pap: 11/21/11. Results were: abnormal   Obstetric History OB History  No data available     The following portions of the patient's history were reviewed and updated as appropriate: allergies, current medications, past family history, past medical history, past social history, past surgical history and problem list.  Review of Systems Pertinent items are noted in HPI.    Objective:    No exam performed today, Consult only..    Assessment:    Postpartum.  Doing well.   Plan:    Education reviewed: Contraceptive options.. Contraception: abstinence. Follow up in: 4 weeks. Continue PNV's

## 2013-01-17 ENCOUNTER — Encounter (HOSPITAL_BASED_OUTPATIENT_CLINIC_OR_DEPARTMENT_OTHER): Payer: Self-pay | Admitting: Emergency Medicine

## 2013-01-17 ENCOUNTER — Emergency Department (HOSPITAL_BASED_OUTPATIENT_CLINIC_OR_DEPARTMENT_OTHER)
Admission: EM | Admit: 2013-01-17 | Discharge: 2013-01-17 | Disposition: A | Discharge status: Home / Self Care | Payer: No Typology Code available for payment source | Attending: Emergency Medicine | Admitting: Emergency Medicine

## 2013-01-17 ENCOUNTER — Emergency Department (HOSPITAL_BASED_OUTPATIENT_CLINIC_OR_DEPARTMENT_OTHER): Payer: No Typology Code available for payment source

## 2013-01-17 DIAGNOSIS — F17200 Nicotine dependence, unspecified, uncomplicated: Secondary | ICD-10-CM | POA: Insufficient documentation

## 2013-01-17 DIAGNOSIS — S46911A Strain of unspecified muscle, fascia and tendon at shoulder and upper arm level, right arm, initial encounter: Secondary | ICD-10-CM

## 2013-01-17 DIAGNOSIS — Y92410 Unspecified street and highway as the place of occurrence of the external cause: Secondary | ICD-10-CM | POA: Insufficient documentation

## 2013-01-17 DIAGNOSIS — Y9389 Activity, other specified: Secondary | ICD-10-CM | POA: Insufficient documentation

## 2013-01-17 DIAGNOSIS — S139XXA Sprain of joints and ligaments of unspecified parts of neck, initial encounter: Secondary | ICD-10-CM | POA: Insufficient documentation

## 2013-01-17 DIAGNOSIS — IMO0002 Reserved for concepts with insufficient information to code with codable children: Secondary | ICD-10-CM | POA: Insufficient documentation

## 2013-01-17 DIAGNOSIS — S161XXA Strain of muscle, fascia and tendon at neck level, initial encounter: Secondary | ICD-10-CM

## 2013-01-17 MED ORDER — CYCLOBENZAPRINE HCL 10 MG PO TABS: 10.0000 mg | ORAL_TABLET | Freq: Three times a day (TID) | ORAL | Status: DC | PRN
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
## 2013-01-21 ENCOUNTER — Ambulatory Visit (INDEPENDENT_AMBULATORY_CARE_PROVIDER_SITE_OTHER): Payer: Medicaid Other | Admitting: Obstetrics & Gynecology

## 2013-01-28 ENCOUNTER — Encounter: Payer: Self-pay | Admitting: Obstetrics & Gynecology

## 2013-01-28 ENCOUNTER — Ambulatory Visit (INDEPENDENT_AMBULATORY_CARE_PROVIDER_SITE_OTHER): Payer: Medicaid Other | Admitting: Obstetrics & Gynecology

## 2013-01-28 DIAGNOSIS — N879 Dysplasia of cervix uteri, unspecified: Secondary | ICD-10-CM

## 2013-01-28 NOTE — Progress Notes (Signed)
Subjective:     Lisa Wright is a 28 y.o. female here for a follow up exam following colposcopy.  Current complaints: pt would like Nexplanon site looked at. Pt states that it is red and itching.  Personal health questionnaire reviewed: yes.   Gynecologic History Patient's last menstrual period was 12/05/2012. Contraception: Nexplanon   Obstetric History OB History  No data available     The following portions of the patient's history were reviewed and updated as appropriate: allergies, current medications, past family history, past medical history, past social history, past surgical history and problem list.  Review of Systems Pertinent items are noted in HPI.    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

## 2013-01-28 NOTE — Patient Instructions (Signed)
Loop Electrosurgical Excision Procedure  Loop electrosurgical excision procedure (LEEP) is the removal of a portion of the lower part of the uterus (cervix). The procedure is done when there are significantly abnormal cervical cell changes. Abnormal cell changes of the cervix can lead to cancer if left in place and untreated.     The LEEP procedure itself typically only takes a few minutes. Often, it may be done in your caregiver's office. The procedure is considered safe for those who wish to get pregnant or are trying to get pregnant. Only under rare circumstances should this procedure be done if you are pregnant.  LET YOUR CAREGIVER KNOW ABOUT:  · Whether you are pregnant or late for your last menstrual period.  · Allergies to foods or medicines.  · All the medicines you are taking including herbs, eyedrops, and over-the-counter medicines, and creams.  · Use of steroids (by mouth or creams).  · Previous problems with anesthetics or numbing medicine.  · Previous gynecological surgery.  · History of blood clots or bleeding problems.  · Any recent or current vaginal infections (herpes, sexually transmitted infections).  · Other health problems.  RISKS AND COMPLICATIONS  · Bleeding.  · Infection.  · Injury to the vagina, bladder, or rectum.  · Very rare obstruction of the cervical opening that causes problems during menstruation (cervical stenosis).  BEFORE THE PROCEDURE  · Do not take aspirin or blood thinners (anticoagulants) for 1 week before the procedure, or as told by your caregiver.  · Eat a light meal before the procedure.  · Ask your caregiver about changing or stopping your regular medicines.  · You may be given a pain reliever 1 or 2 hours before the procedure.  PROCEDURE   · A tool (speculum) is placed in the vagina. This allows your caregiver to see the cervix.  · An iodine stain is applied to the cervix to find the area of abnormal cells to be removed.  · Medicine is injected to numb the cervix (local  anesthetic).    · Electricity is passed through a thin wire loop which is then used to remove (cauterize) a small segment of the affected cervix.  · Light electrocautery is used to seal any small blood vessels and prevent bleeding.  · A paste may be applied to the cauterized area of the cervix to help prevent bleeding.  · The tissue sample is sent to the lab. It is examined under the microscope.  AFTER THE PROCEDURE  · Have someone drive you home.  · You may have slight to moderate cramping.  · You may notice a black vaginal discharge from the paste used on the cervix to prevent bleeding. This is normal.  · Watch for excessive bleeding. This requires immediate medical care.  · Ask when your test results will be ready. Make sure you get your test results.  Document Released: 03/31/2002 Document Revised: 04/02/2011 Document Reviewed: 06/20/2010  [employer]® Patient Information ©2014 [employer].

## 2013-02-05 ENCOUNTER — Ambulatory Visit (INDEPENDENT_AMBULATORY_CARE_PROVIDER_SITE_OTHER): Payer: Medicaid Other | Admitting: Obstetrics & Gynecology

## 2013-02-05 ENCOUNTER — Encounter: Payer: Self-pay | Admitting: Obstetrics & Gynecology

## 2013-02-05 VITALS — BP 120/78 | HR 83 | Temp 98.2°F | Ht 66.0 in | Wt 256.0 lb

## 2013-02-05 DIAGNOSIS — N871 Moderate cervical dysplasia: Secondary | ICD-10-CM

## 2013-02-05 DIAGNOSIS — T8140XA Infection following a procedure, unspecified, initial encounter: Secondary | ICD-10-CM

## 2013-02-05 DIAGNOSIS — O2390 Unspecified genitourinary tract infection in pregnancy, unspecified trimester: Secondary | ICD-10-CM

## 2013-02-05 DIAGNOSIS — Z01812 Encounter for preprocedural laboratory examination: Secondary | ICD-10-CM

## 2013-02-05 DIAGNOSIS — Z3202 Encounter for pregnancy test, result negative: Secondary | ICD-10-CM

## 2013-02-05 LAB — POCT URINE PREGNANCY: Preg Test, Ur: NEGATIVE

## 2013-02-05 NOTE — Progress Notes (Signed)
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  The patient was placed in lithotomy position.  An insulated speculum was placed within the vagina and the cervix was well visualized.  The cervix was then observed colposcopically by placement of acetic acid.  The colposcopic findings were confirmed. 2% lidocaine with dilute epinephrine was then used to infiltrated the margins of the exocervix and blanching was noted.  The loop electrode was then selected and used to excise the transformation zone including the abnormal appearing areas.  This specimen was then sent for pathologic evaluation.  The bed of the excision site was then fulgurated with the ball electrode and Monsel's solution was applied.  INSTRUCTIONS:  The speculum was then removed and the patient instructed to perform light activities for the rest of the day.  She is to insert nothing in the vagina during this time period.  She was also instructed to avoid intercourse for 4 weeks.  Pre-printed instructions were given to the patient. 

## 2013-02-06 ENCOUNTER — Encounter: Payer: Self-pay | Admitting: Obstetrics

## 2013-02-06 ENCOUNTER — Encounter: Payer: Self-pay | Admitting: Obstetrics & Gynecology

## 2013-02-06 ENCOUNTER — Ambulatory Visit (INDEPENDENT_AMBULATORY_CARE_PROVIDER_SITE_OTHER): Payer: Medicaid Other | Admitting: Obstetrics

## 2013-02-06 VITALS — BP 111/65 | HR 66 | Temp 99.0°F | Ht 66.0 in | Wt 257.0 lb

## 2013-02-06 DIAGNOSIS — T8140XA Infection following a procedure, unspecified, initial encounter: Secondary | ICD-10-CM | POA: Insufficient documentation

## 2013-02-06 NOTE — Progress Notes (Signed)
Subjective:     Lisa Wright is a 28 y.o. female here for a routine exam.  Current complaints: Patient here for Nexplanon wound check.  Personal health questionnaire reviewed: not asked.   Gynecologic History Patient's last menstrual period was 12/05/2012. Contraception: abstinence   Obstetric History OB History  No data available     The following portions of the patient's history were reviewed and updated as appropriate: allergies and current medications.  Review of Systems Pertinent items are noted in HPI.    Objective:    Extremities: Right upper arm:  Packing removed and moist Kerlex replaced in open wound.    Assessment:    Post op wound infection of Nexplanon site.  Rod has been removed after I&D of abscess.   Plan:    Education reviewed: Daily wet to dry dressing changes. Follow up in: 1 day. Continue Augmentin.  Check wound culture.

## 2013-02-07 ENCOUNTER — Inpatient Hospital Stay (HOSPITAL_COMMUNITY)
Admission: AD | Admit: 2013-02-07 | Discharge: 2013-02-07 | Disposition: A | Discharge status: Home / Self Care | Payer: Medicaid Other | Source: Ambulatory Visit | Attending: Obstetrics | Admitting: Obstetrics

## 2013-02-07 DIAGNOSIS — N871 Moderate cervical dysplasia: Secondary | ICD-10-CM | POA: Insufficient documentation

## 2013-02-07 DIAGNOSIS — IMO0002 Reserved for concepts with insufficient information to code with codable children: Secondary | ICD-10-CM | POA: Insufficient documentation

## 2013-02-07 DIAGNOSIS — L089 Local infection of the skin and subcutaneous tissue, unspecified: Secondary | ICD-10-CM | POA: Insufficient documentation

## 2013-02-07 DIAGNOSIS — Y849 Medical procedure, unspecified as the cause of abnormal reaction of the patient, or of later complication, without mention of misadventure at the time of the procedure: Secondary | ICD-10-CM | POA: Insufficient documentation

## 2013-02-07 MED ORDER — KETOROLAC TROMETHAMINE 60 MG/2ML IM SOLN
60.0000 mg | Freq: Once | INTRAMUSCULAR | Status: AC
  Administered 2013-02-07: 60 mg via INTRAMUSCULAR
  Filled 2013-02-07: qty 2

## 2013-02-07 NOTE — Patient Instructions (Signed)
Abscess Care After An abscess (also called a boil or furuncle) is an infected area that contains a collection of pus. Signs and symptoms of an abscess include pain, tenderness, redness, or hardness, or you may feel a moveable soft area under your skin. An abscess can occur anywhere in the body. The infection may spread to surrounding tissues causing cellulitis. A cut (incision) by the surgeon was made over your abscess and the pus was drained out. Gauze may have been packed into the space to provide a drain that will allow the cavity to heal from the inside outwards. The boil may be painful for 5 to 7 days. Most people with a boil do not have high fevers. Your abscess, if seen early, may not have localized, and may not have been lanced. If not, another appointment may be required for this if it does not get better on its own or with medications. HOME CARE INSTRUCTIONS   Only take over-the-counter or prescription medicines for pain, discomfort, or fever as directed by your caregiver.  When you bathe, soak and then remove gauze or iodoform packs at least daily or as directed by your caregiver. You may then wash the wound gently with mild soapy water. Repack with gauze or do as your caregiver directs. SEEK IMMEDIATE MEDICAL CARE IF:   You develop increased pain, swelling, redness, drainage, or bleeding in the wound site.  You develop signs of generalized infection including muscle aches, chills, fever, or a general ill feeling.  An oral temperature above 102 F (38.9 C) develops, not controlled by medication. See your caregiver for a recheck if you develop any of the symptoms described above. If medications (antibiotics) were prescribed, take them as directed. Document Released: 07/27/2004 Document Revised: 04/02/2011 Document Reviewed: 03/24/2007 [facility] Patient Information 2014 [employer], [place]. LEEP POST-PROCEDURE INSTRUCTIONS  1. You may take Ibuprofen, Aleve or Tylenol for pain if needed.   Cramping is normal.  2. You will have black and/or bloody discharge at first.  This will lighten and then turn clear before completely resolving.  This will take 2 to 3 weeks.  3. Put nothing in your vagina until the bleeding or discharge stops (usually 2 or3 days).  4. You need to call if you have redness around the biopsy site, if there is any unusual draining, if the bleeding is heavy, or if you are concerned.  5. Shower or bathe as normal  6. We will call you within one week with results or we will discuss the results at your follow-up appointment if needed.  7. You will need to return for a follow-up Pap smear as directed by your physician.

## 2013-02-07 NOTE — Progress Notes (Signed)
Incision and Drainage Procedure Note  Pre-operative Diagnosis: Infected Nexplanon site  Post-operative Diagnosis: same  Indications: See above  Anesthesia: 1% plain lidocaine  Procedure Details  The procedure, risks and complications have been discussed in detail (including, but not limited to airway compromise, infection, bleeding) with the patient, and the patient has signed consent to the procedure.  The skin was sterilely prepped and draped over the affected area in the usual fashion. After adequate local anesthesia, I&D with a #15 blade was performed on the RUE over the Nexplanon. The Nexplanon was removed.  Purulent drainage: absent Cultures were sent.  The wound was irrigated/dressed.  Findings: The implant was extruding proximally.  The overlying skin was indurated/erythematous  EBL: minimal    Condition: Tolerated procedure well   Complications: none.

## 2013-02-07 NOTE — MAU Note (Signed)
Here for incision packing of nexplanon infection to r upper arm.

## 2013-02-08 LAB — WOUND CULTURE: GRAM STAIN: NONE SEEN

## 2013-02-10 ENCOUNTER — Inpatient Hospital Stay (HOSPITAL_COMMUNITY)
Admission: AD | Admit: 2013-02-10 | Discharge: 2013-02-10 | Disposition: A | Discharge status: Home / Self Care | Payer: Medicaid Other | Source: Ambulatory Visit | Attending: Obstetrics | Admitting: Obstetrics

## 2013-02-10 DIAGNOSIS — Z4800 Encounter for change or removal of nonsurgical wound dressing: Secondary | ICD-10-CM | POA: Insufficient documentation

## 2013-02-10 NOTE — MAU Provider Note (Signed)
Ms. Jaedan Schuman is a 28 y.o. female who presents to MAU today for a dressing change. The patient had an infection at the site of her Nexplanon removal. She denies fever today.   GENERAL: Well-developed, well-nourished female in no acute distress.  HEENT: Normocephalic, atraumatic.   LUNGS: Effort normal HEART: Regular rate  SKIN: Warm, dry and without erythema PSYCH: Normal mood and affect WOUND: 5 cm x 2 cm healing ulceration on the interior of the right arm without significant surrounding erythema, drainage or edema  MDM Old dressing was removed and area was covered with iodoform packing, a non-adherent pad and tape  A: Wound from Nexplanon removal  P: Discharge home Patient advised to keep scheduled follow-up in the office for tomorrow Patient may return to MAU as needed or if her condition were to change or worsen  Farris Has, PA-C  02/10/2013 8:03 AM

## 2013-02-10 NOTE — Progress Notes (Signed)
Patient presents for dressing change of right upper arm. Dressing removed. No drainage or odor noted. Surrounding skin has no swelling or redness. Slight irritation noted from adhesive tape. Iodoform packing placed on top of wound, covered with telfa by Bethena Midget, PA. Hypafix adhesive used instead of tape to secure dressing. Patient states the wound already feels better with new and different type of dressing. Patient will F/U in office tomorrow.

## 2013-02-11 ENCOUNTER — Ambulatory Visit: Payer: Medicaid Other | Admitting: Obstetrics & Gynecology

## 2013-02-16 ENCOUNTER — Encounter: Payer: Self-pay | Admitting: Obstetrics & Gynecology

## 2013-02-16 ENCOUNTER — Encounter: Payer: Self-pay | Admitting: *Deleted

## 2013-02-16 DIAGNOSIS — N870 Mild cervical dysplasia: Secondary | ICD-10-CM | POA: Insufficient documentation

## 2013-03-02 ENCOUNTER — Encounter: Payer: Self-pay | Admitting: Obstetrics & Gynecology

## 2013-03-02 ENCOUNTER — Ambulatory Visit (INDEPENDENT_AMBULATORY_CARE_PROVIDER_SITE_OTHER): Payer: Medicaid Other | Admitting: Obstetrics & Gynecology

## 2013-03-02 VITALS — BP 118/71 | HR 78 | Temp 98.5°F | Ht 66.0 in | Wt 260.0 lb

## 2013-03-02 DIAGNOSIS — Z30017 Encounter for initial prescription of implantable subdermal contraceptive: Secondary | ICD-10-CM

## 2013-03-02 NOTE — Progress Notes (Signed)
Subjective:     Lisa Wright is a 28 y.o. female here for a routine exam.  Current complaints: Patient in office today for follow up from leep. Patient states there were no complications. Patient denies any concerns. Patient states her period started 2 days after the procedure and didn't go off and didn't go off until february 6. Patient would like to talk about getting her Nexplanon put back in. Personal health questionnaire reviewed: yes.   Gynecologic History Patient's last menstrual period was 02/07/2013. Contraception: none Last Pap: 11/20/2012. Results were: abnormal  Obstetric History OB History  Gravida Para Term Preterm AB SAB TAB Ectopic Multiple Living  3 3 3       3     # Outcome Date GA Lbr Len/2nd Weight Sex Delivery Anes PTL Lv  3 TRM 06/01/09 [redacted]w[redacted]d  5 lb 4 oz (2.381 kg) M LTCS EPI  Y  2 TRM 03/11/07 [redacted]w[redacted]d  5 lb 6 oz (2.438 kg) M LTCS EPI  Y  1 TRM 04/21/04 [redacted]w[redacted]d  6 lb 10 oz (3.005 kg) M SVD EPI  Y       The following portions of the patient's history were reviewed and updated as appropriate: allergies, current medications, past family history, past medical history, past social history, past surgical history and problem list.  Review of Systems Pertinent items are noted in HPI. Objective:     RUE: granulation tissue present SPEC: cone bed healing well  NEXPLANON INSERTION NOTE   Pregnancy test result:  Lab Results  Component Value Date   PREGTESTUR Negative 02/05/2013    Indications:  The patient desires contraception.  She understands risks, benefits, and alternatives to Implanon and would like to proceed.  Anesthesia:   Lidocaine 1% plain.  Procedure:  A time-out was performed confirming the procedure and the patient's allergy status.  The patient's non-dominant was identified as the left arm.  The protection cap was removed. While placing countertraction on the skin, the needle was inserted at a 30 degree angle.  The applicator was held horizontal to the  skin; the skin was tented upward as the needle was introduced into the subdermal space.  While holding the applicator in place, the slider was unlocked. The Nexplanon was removed from the field.  The Nexplanon was palpated to ensure proper placement.  Complications: None  Instructions:  The patient was instructed to remove the dressing in 24 hours and that some bruising is to be expected.  She was advised to use over the counter analgesics as needed for any pain at the site.  She is to keep the area dry for 24 hours and to call if her hand or arm becomes cold, numb, or blue.  Return visit:  Return in 2 weeks     Repeat Pap/HPV co-test

## 2013-03-18 ENCOUNTER — Ambulatory Visit (INDEPENDENT_AMBULATORY_CARE_PROVIDER_SITE_OTHER): Payer: Medicaid Other | Admitting: Obstetrics & Gynecology

## 2013-03-18 DIAGNOSIS — L089 Local infection of the skin and subcutaneous tissue, unspecified: Secondary | ICD-10-CM

## 2013-03-18 DIAGNOSIS — T148XXA Other injury of unspecified body region, initial encounter: Principal | ICD-10-CM

## 2013-03-18 DIAGNOSIS — Z3046 Encounter for surveillance of implantable subdermal contraceptive: Secondary | ICD-10-CM

## 2013-03-18 DIAGNOSIS — Z975 Presence of (intrauterine) contraceptive device: Secondary | ICD-10-CM

## 2013-03-18 NOTE — Progress Notes (Signed)
Subjective:     Lisa Wright is a 28 y.o. female here for a follow up exam.  Current complaints: pt is here today for Nexplanon f/u.  Pt states right arm is healing well after infection from Nexplanon.  Pt is needing referral for dermatology for sores.  Personal health questionnaire reviewed: yes.   Gynecologic History Patient's last menstrual period was 02/07/2013. Contraception: Nexplanon    Obstetric History OB History  Gravida Para Term Preterm AB SAB TAB Ectopic Multiple Living  3 3 3       3     # Outcome Date GA Lbr Len/2nd Weight Sex Delivery Anes PTL Lv  3 TRM 06/01/09 [redacted]w[redacted]d  5 lb 4 oz (2.381 kg) M LTCS EPI  Y  2 TRM 03/11/07 [redacted]w[redacted]d  5 lb 6 oz (2.438 kg) M LTCS EPI  Y  1 TRM 04/21/04 [redacted]w[redacted]d  6 lb 10 oz (3.005 kg) M SVD EPI  Y       The following portions of the patient's history were reviewed and updated as appropriate: allergies, current medications, past family history, past medical history, past social history, past surgical history and problem list.  Review of Systems Pertinent items are noted in HPI.    Objective:   UE:  Bilaterally--well-healed  Assessment:   Previous infected Nexplanon site well-healed/resolved S/P Nexplanon placement LUE  Plan:   Return prn

## 2013-03-19 ENCOUNTER — Encounter: Payer: Self-pay | Admitting: Obstetrics & Gynecology

## 2013-03-21 LAB — MRSA CULTURE

## 2013-03-25 ENCOUNTER — Encounter: Payer: Self-pay | Admitting: Obstetrics & Gynecology

## 2013-04-15 ENCOUNTER — Encounter: Payer: Self-pay | Admitting: Obstetrics & Gynecology

## 2013-06-30 ENCOUNTER — Encounter: Payer: Self-pay | Admitting: Advanced Practice Midwife

## 2013-06-30 ENCOUNTER — Ambulatory Visit (INDEPENDENT_AMBULATORY_CARE_PROVIDER_SITE_OTHER): Payer: Medicaid Other | Admitting: Advanced Practice Midwife

## 2013-06-30 VITALS — BP 122/57 | HR 68 | Wt 254.0 lb

## 2013-06-30 DIAGNOSIS — R399 Unspecified symptoms and signs involving the genitourinary system: Secondary | ICD-10-CM

## 2013-06-30 DIAGNOSIS — R3989 Other symptoms and signs involving the genitourinary system: Secondary | ICD-10-CM

## 2013-06-30 DIAGNOSIS — Z113 Encounter for screening for infections with a predominantly sexual mode of transmission: Secondary | ICD-10-CM

## 2013-06-30 LAB — POCT URINALYSIS DIPSTICK
Bilirubin, UA: NEGATIVE
Blood, UA: 250
Glucose, UA: NEGATIVE
Ketones, UA: NEGATIVE
NITRITE UA: NEGATIVE
PH UA: 6
Spec Grav, UA: 1.01
UROBILINOGEN UA: NEGATIVE

## 2013-06-30 NOTE — Progress Notes (Signed)
Lisa Wright is a 28 y.o.who presents for irregular menses. Patient's last menstrual period was 04/27/2013. Menarche age: 54. Periods are irregular, normally 3 days every month w/ nexplanon, lately bleeding heavy each day since April 28,  Dysmenorrhea:mild, occurring occasionally. Nexplanon in place. Desires STI screening.History of infertility: no. History of abnormal Pap smear: no.  Patient reports abnormal heavy bleeding, w/ out relief. Denies family hx of blood clot or liver disease.  Patient Active Problem List   Diagnosis Date Noted  . CIN I (cervical intraepithelial neoplasia I) 02/16/2013  . 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 No pertinent past medical history.  Past Surgical History  Procedure Laterality Date  . Cesarean section    . Cesarean section    . Colposcopy      No current outpatient prescriptions on file. Allergies  Allergen Reactions  . Percocet [Oxycodone-Acetaminophen] Itching    History  Substance Use Topics  . Smoking status: Current Every Day Smoker -- 0.50 packs/day for 14 years  . Smokeless tobacco: Never Used  . Alcohol Use: No    Family History  Problem Relation Age of Onset  . Diabetes Father   . Dementia Maternal Grandmother   . Diabetes Maternal Grandfather   . Diabetes Paternal Grandmother      Review of Systems Constitutional: negative for fatigue and weight loss Respiratory: negative for cough and wheezing Cardiovascular: negative for chest pain, fatigue and palpitations Gastrointestinal: negative for abdominal pain and change in bowel habits Genitourinary:negative Integument/breast: negative for nipple discharge Musculoskeletal:negative for myalgias, left and right leg w/ soft fluid filled mass on shin bones. Smooth. Neurological: negative for gait problems and tremors Behavioral/Psych: negative for abusive relationship, depression Endocrine: negative for temperature intolerance     Lab Review Urine  pregnancy test Labs reviewed yes Radiologic studies reviewed yes  Objective:  BP 122/57  Pulse 68  Wt 254 lb (115.214 kg)  LMP 05/20/2013 General:   alert  Skin:   no rash or abnormalities  Lungs:   clear to auscultation bilaterally  Heart:   regular rate and rhythm, S1, S2 normal, no murmur, click, rub or gallop  Breasts:   normal without suspicious masses, skin or nipple changes or axillary nodes  Abdomen:  normal findings: no organomegaly, soft, non-tender and no hernia  Pelvis:  External genitalia: normal general appearance Urinary system: urethral meatus normal and bladder without fullness, nontender Vaginal: normal without tenderness, induration or masses Cervix: normal appearance, patient had mild tenderness w/ cervical exam Adnexa: normal bimanual exam Uterus: anteverted and non-tender, normal size    Assessment:   STI screening pending  Cannot rule out yeast of BV today Heavy bleeding w/ Nexplanon in place Elevated BMI    Plan:    Suspicous for infection. If all screening is negative will offer OCP to help stop bleeding at this time.    No orders of the defined types were placed in this encounter.   Orders Placed This Encounter  Procedures  . WET PREP BY MOLECULAR PROBE  . GC/Chlamydia Probe Amp  . Urine culture  . POCT urinalysis dipstick   Recommend PCP referral for leg mass Possible management options include: treatment of infection if positive, if not consider OCP, short term. Follow up as needed.  Teka Chanda Roni Bread CNM

## 2013-06-30 NOTE — Addendum Note (Signed)
Addended by: Lewie Loron D on: 06/30/2013 04:45 PM   Modules accepted: Orders

## 2013-07-01 ENCOUNTER — Ambulatory Visit: Payer: Medicaid Other | Admitting: Obstetrics & Gynecology

## 2013-07-01 LAB — WET PREP BY MOLECULAR PROBE
Candida species: NEGATIVE
Gardnerella vaginalis: NEGATIVE
Trichomonas vaginosis: POSITIVE — AB

## 2013-07-01 LAB — HIV ANTIBODY (ROUTINE TESTING W REFLEX): HIV: NONREACTIVE

## 2013-07-01 LAB — URINE CULTURE
Colony Count: NO GROWTH
ORGANISM ID, BACTERIA: NO GROWTH

## 2013-07-01 LAB — GC/CHLAMYDIA PROBE AMP
CT Probe RNA: NEGATIVE
GC Probe RNA: NEGATIVE

## 2013-07-01 LAB — HEPATITIS B SURFACE ANTIGEN: Hepatitis B Surface Ag: NEGATIVE

## 2013-07-01 LAB — RPR

## 2013-07-01 LAB — HEPATITIS C ANTIBODY: HCV Ab: NEGATIVE

## 2013-07-03 ENCOUNTER — Other Ambulatory Visit: Payer: Self-pay | Admitting: Advanced Practice Midwife

## 2013-07-03 DIAGNOSIS — A599 Trichomoniasis, unspecified: Secondary | ICD-10-CM

## 2013-07-03 DIAGNOSIS — N939 Abnormal uterine and vaginal bleeding, unspecified: Secondary | ICD-10-CM

## 2013-07-03 MED ORDER — METRONIDAZOLE 500 MG PO TABS: 2000.0000 mg | ORAL_TABLET | Freq: Once | ORAL | Status: DC

## 2013-07-06 ENCOUNTER — Telehealth: Payer: Self-pay | Admitting: *Deleted

## 2013-07-06 NOTE — Telephone Encounter (Signed)
Patient called regarding bleeding- LM on VM to CB

## 2013-07-07 ENCOUNTER — Other Ambulatory Visit: Payer: Self-pay | Admitting: Advanced Practice Midwife

## 2013-07-07 DIAGNOSIS — Z30011 Encounter for initial prescription of contraceptive pills: Secondary | ICD-10-CM

## 2013-07-07 MED ORDER — NORGESTIMATE-ETH ESTRADIOL 0.25-35 MG-MCG PO TABS: 1.0000 | ORAL_TABLET | Freq: Every day | ORAL | Status: DC

## 2013-07-07 NOTE — Progress Notes (Unsigned)
Will place patient on OCP to help stop bleeding on Nexplanon. If unsuccessful consider removal if remains problematic to patient. Rx sent to pharmacy. Left message to call back.   Amy Roni Bread CNM

## 2013-07-07 NOTE — Telephone Encounter (Signed)
Spoke with patient- patient states she was seen by Amy and treated for infection. Patient states she was told if she did not stop bleeding after the treatment to call back for OCP to stop BTB on her Nexplanon. Patient reports she is still bleeding.

## 2013-08-18 NOTE — Telephone Encounter (Signed)
Call to patient- left message to call back- call had been forwarded to Amy and not sure if she ever contacted the patient. Please call back if she is still having issues. We are going to close this call note today.

## 2013-08-24 ENCOUNTER — Emergency Department (HOSPITAL_BASED_OUTPATIENT_CLINIC_OR_DEPARTMENT_OTHER)
Admission: EM | Admit: 2013-08-24 | Discharge: 2013-08-24 | Disposition: A | Discharge status: Home / Self Care | Payer: Medicaid Other | Attending: Emergency Medicine | Admitting: Emergency Medicine

## 2013-08-24 ENCOUNTER — Encounter (HOSPITAL_BASED_OUTPATIENT_CLINIC_OR_DEPARTMENT_OTHER): Payer: Self-pay | Admitting: Emergency Medicine

## 2013-08-24 DIAGNOSIS — Z792 Long term (current) use of antibiotics: Secondary | ICD-10-CM | POA: Insufficient documentation

## 2013-08-24 DIAGNOSIS — F17200 Nicotine dependence, unspecified, uncomplicated: Secondary | ICD-10-CM | POA: Diagnosis not present

## 2013-08-24 DIAGNOSIS — Z3202 Encounter for pregnancy test, result negative: Secondary | ICD-10-CM | POA: Insufficient documentation

## 2013-08-24 DIAGNOSIS — Z79899 Other long term (current) drug therapy: Secondary | ICD-10-CM | POA: Insufficient documentation

## 2013-08-24 DIAGNOSIS — N898 Other specified noninflammatory disorders of vagina: Secondary | ICD-10-CM

## 2013-08-24 DIAGNOSIS — L293 Anogenital pruritus, unspecified: Secondary | ICD-10-CM | POA: Insufficient documentation

## 2013-08-24 LAB — PREGNANCY, URINE: Preg Test, Ur: NEGATIVE

## 2013-08-24 LAB — URINALYSIS, ROUTINE W REFLEX MICROSCOPIC
BILIRUBIN URINE: NEGATIVE
GLUCOSE, UA: NEGATIVE mg/dL
HGB URINE DIPSTICK: NEGATIVE
KETONES UR: NEGATIVE mg/dL
NITRITE: NEGATIVE
PH: 6.5 (ref 5.0–8.0)
Protein, ur: NEGATIVE mg/dL
SPECIFIC GRAVITY, URINE: 1.025 (ref 1.005–1.030)
Urobilinogen, UA: 1 mg/dL (ref 0.0–1.0)

## 2013-08-24 LAB — URINE MICROSCOPIC-ADD ON

## 2013-08-24 LAB — RPR

## 2013-08-24 LAB — HIV ANTIBODY (ROUTINE TESTING W REFLEX): HIV 1&2 Ab, 4th Generation: NONREACTIVE

## 2013-08-24 LAB — WET PREP, GENITAL
Clue Cells Wet Prep HPF POC: NONE SEEN
Trich, Wet Prep: NONE SEEN
YEAST WET PREP: NONE SEEN

## 2013-08-24 MED ORDER — BENZOCAINE-RESORCINOL 5-2 % VA CREA: TOPICAL_CREAM | Freq: Every day | VAGINAL | Status: DC

## 2013-08-24 NOTE — Discharge Instructions (Signed)
Your tests did not show a clear cause for your itching. Please follow up with your gynecologist.

## 2013-08-24 NOTE — ED Notes (Signed)
Pt reports onset of vaginal itching and irritations x4 days  OTC cream ineffective for relief denies discharge

## 2013-08-24 NOTE — ED Provider Notes (Signed)
CSN: [id number]     Arrival date & time 08/24/13  0533 History   First MD Initiated Contact with Patient 08/24/13 0557     Chief Complaint  Patient presents with  . Vaginal Itching     (Consider location/radiation/quality/duration/timing/severity/associated sxs/prior Treatment) Patient is a 28 y.o. female presenting with vaginal itching. The history is provided by the patient.  Vaginal Itching  She complains of vaginal itching and your dictation for the last 4 days. Symptoms are getting worse. She denies any discharge. She denies any abdominal pain or flank pain. She denies fever, chills, sweats. She denies nausea or vomiting.  History reviewed. No pertinent past medical history. Past Surgical History  Procedure Laterality Date  . Cesarean section    . Cesarean section    . Colposcopy     Family History  Problem Relation Age of Onset  . Diabetes Father   . Dementia Maternal Grandmother   . Diabetes Maternal Grandfather   . Diabetes Paternal Grandmother    History  Substance Use Topics  . Smoking status: Current Every Day Smoker -- 0.50 packs/day for 14 years  . Smokeless tobacco: Never Used  . Alcohol Use: No   OB History   Grav Para Term Preterm Abortions TAB SAB Ect Mult Living   3 3 3       3      Review of Systems  All other systems reviewed and are negative.     Allergies  Percocet  Home Medications   Prior to Admission medications   Medication Sig Start Date End Date Taking? Authorizing Provider  metroNIDAZOLE (FLAGYL) 500 MG tablet Take 4 tablets (2,000 mg total) by mouth once. Take with food, avoid alcohol. 07/03/13   Amy Thereasa Parkin, CNM  norgestimate-ethinyl estradiol (ORTHO-CYCLEN,SPRINTEC,PREVIFEM) 0.25-35 MG-MCG tablet Take 1 tablet by mouth daily. 07/07/13   Amy Thereasa Parkin, CNM   BP 118/69  Pulse 78  Temp(Src) 98.4 F (36.9 C) (Oral)  Resp 16  Ht 5\' 6"  (1.676 m)  Wt 230 lb (104.327 kg)  BMI 37.14 kg/m2  SpO2 100%  LMP 08/10/2013 Physical  Exam  Nursing note and vitals reviewed.  28 year old female, resting comfortably and in no acute distress. Vital signs are normal. Oxygen saturation is 100%, which is normal. Head is normocephalic and atraumatic. PERRLA, EOMI. Oropharynx is clear. Neck is nontender and supple without adenopathy or JVD. Back is nontender and there is no CVA tenderness. Lungs are clear without rales, wheezes, or rhonchi. Chest is nontender. Heart has regular rate and rhythm without murmur. Abdomen is soft, flat, nontender without masses or hepatosplenomegaly and peristalsis is normoactive. Pelvic: Normal external female genitalia, small amount of white vaginal discharge present. Cervix is closed. Fundus is normal size and position. There no adnexal masses or tenderness. Extremities have no cyanosis or edema, full range of motion is present. Skin is warm and dry without rash. Neurologic: Mental status is normal, cranial nerves are intact, there are no motor or sensory deficits.  ED Course  Procedures (including critical care time) Labs Review Results for orders placed during the hospital encounter of 08/24/13  WET PREP, GENITAL      Result Value Ref Range   Yeast Wet Prep HPF POC NONE SEEN  NONE SEEN   Trich, Wet Prep NONE SEEN  NONE SEEN   Clue Cells Wet Prep HPF POC NONE SEEN  NONE SEEN   WBC, Wet Prep HPF POC TOO NUMEROUS TO COUNT (*) NONE SEEN  URINALYSIS, ROUTINE W  REFLEX MICROSCOPIC      Result Value Ref Range   Color, Urine YELLOW  YELLOW   APPearance CLOUDY (*) CLEAR   Specific Gravity, Urine 1.025  1.005 - 1.030   pH 6.5  5.0 - 8.0   Glucose, UA NEGATIVE  NEGATIVE mg/dL   Hgb urine dipstick NEGATIVE  NEGATIVE   Bilirubin Urine NEGATIVE  NEGATIVE   Ketones, ur NEGATIVE  NEGATIVE mg/dL   Protein, ur NEGATIVE  NEGATIVE mg/dL   Urobilinogen, UA 1.0  0.0 - 1.0 mg/dL   Nitrite NEGATIVE  NEGATIVE   Leukocytes, UA LARGE (*) NEGATIVE  PREGNANCY, URINE      Result Value Ref Range   Preg Test, Ur  NEGATIVE  NEGATIVE  URINE MICROSCOPIC-ADD ON      Result Value Ref Range   Squamous Epithelial / LPF MANY (*) RARE   WBC, UA 7-10  <3 WBC/hpf   RBC / HPF 0-2  <3 RBC/hpf   Bacteria, UA MANY (*) RARE   MDM   Final diagnoses:  Vaginal itching    Vaginal irritation with discharge. Wet prep has been sent. Old records are reviewed and she was seen in her gynecologist's office in June and wet prep at that time showed trichomonas. She was treated with metronidazole and she states that her partner was also treated. Symptoms today are similar to what she had then.  Wet prep is negative for yeast and trichomonas as well as clue cells. Causes of itching is not clear. She is given a prescription for Vagisil and instructed to follow up with her gynecologist.  Delora Fuel, MD [id number]/[id number]

## 2013-08-25 LAB — GC/CHLAMYDIA PROBE AMP
CT Probe RNA: NEGATIVE
GC Probe RNA: NEGATIVE

## 2013-11-23 ENCOUNTER — Encounter (HOSPITAL_BASED_OUTPATIENT_CLINIC_OR_DEPARTMENT_OTHER): Payer: Self-pay | Admitting: Emergency Medicine

## 2014-01-18 ENCOUNTER — Encounter: Payer: Self-pay | Admitting: *Deleted

## 2014-01-19 ENCOUNTER — Encounter: Payer: Self-pay | Admitting: Obstetrics & Gynecology

## 2014-01-22 DIAGNOSIS — C539 Malignant neoplasm of cervix uteri, unspecified: Secondary | ICD-10-CM

## 2014-01-22 HISTORY — DX: Malignant neoplasm of cervix uteri, unspecified: C53.9

## 2014-03-01 ENCOUNTER — Encounter (HOSPITAL_BASED_OUTPATIENT_CLINIC_OR_DEPARTMENT_OTHER): Payer: Self-pay | Admitting: Emergency Medicine

## 2014-03-01 ENCOUNTER — Emergency Department (HOSPITAL_BASED_OUTPATIENT_CLINIC_OR_DEPARTMENT_OTHER)
Admission: EM | Admit: 2014-03-01 | Discharge: 2014-03-01 | Disposition: A | Discharge status: Home / Self Care | Payer: Medicaid Other | Attending: Emergency Medicine | Admitting: Emergency Medicine

## 2014-03-01 DIAGNOSIS — M62838 Other muscle spasm: Secondary | ICD-10-CM | POA: Diagnosis not present

## 2014-03-01 DIAGNOSIS — Z720 Tobacco use: Secondary | ICD-10-CM | POA: Insufficient documentation

## 2014-03-01 DIAGNOSIS — M549 Dorsalgia, unspecified: Secondary | ICD-10-CM | POA: Diagnosis present

## 2014-03-01 MED ORDER — CYCLOBENZAPRINE HCL 5 MG PO TABS: 5.0000 mg | ORAL_TABLET | Freq: Two times a day (BID) | ORAL | Status: DC | PRN

## 2014-03-01 MED ORDER — HYDROCODONE-ACETAMINOPHEN 5-325 MG PO TABS: 1.0000 | ORAL_TABLET | ORAL | Status: DC | PRN

## 2014-03-01 NOTE — ED Provider Notes (Signed)
CSN: [id number]     Arrival date & time 03/01/14  5038 History   First MD Initiated Contact with Patient 03/01/14 [phone]     Chief Complaint  Patient presents with  . Neck Pain  . Back Pain     (Consider location/radiation/quality/duration/timing/severity/associated sxs/prior Treatment) HPI Comments: Pt comes in with c/o upper neck and back pain. States that it was like that when she woke up yesterday but it hasn't gone away. She tried heat and tylenol. Denies numbness or weakness. States that she has had this before but it usually goes away with tylenol. States that she can't turn her heard to the side. No injury  The history is provided by the patient. No language interpreter was used.    No past medical history on file. Past Surgical History  Procedure Laterality Date  . Cesarean section    . Cesarean section    . Colposcopy     Family History  Problem Relation Age of Onset  . Diabetes Father   . Dementia Maternal Grandmother   . Diabetes Maternal Grandfather   . Diabetes Paternal Grandmother    History  Substance Use Topics  . Smoking status: Current Every Day Smoker -- 0.50 packs/day for 14 years  . Smokeless tobacco: Never Used  . Alcohol Use: No   OB History    Gravida Para Term Preterm AB TAB SAB Ectopic Multiple Living   3 3 3       3      Review of Systems  All other systems reviewed and are negative.     Allergies  Percocet  Home Medications   Prior to Admission medications   Medication Sig Start Date End Date Taking? Authorizing Provider  etonogestrel (NEXPLANON) 68 MG IMPL implant 1 each by Subdermal route once.   Yes Historical Provider, MD   BP 125/67 mmHg  Pulse 59  Temp(Src) 98.4 F (36.9 C) (Oral)  Resp 18  SpO2 100% Physical Exam  Constitutional: She is oriented to person, place, and time. She appears well-developed and well-nourished.  Cardiovascular: Normal rate and regular rhythm.   Pulmonary/Chest: Effort normal and breath sounds  normal.  Musculoskeletal: Normal range of motion.       Arms: Tenderness. Grip strength equal. Full rom of upper extremity  Neurological: She is alert and oriented to person, place, and time. She exhibits normal muscle tone. Coordination normal.  Skin: Skin is warm and dry.  Psychiatric: She has a normal mood and affect.  Nursing note and vitals reviewed.   ED Course  Procedures (including critical care time) Labs Review Labs Reviewed - No data to display  Imaging Review No results found.   EKG Interpretation None      MDM   Final diagnoses:  Muscle spasm    Pt is neurologically intact. Likely spasm. Will treat symptomatically with vicodin and flexeril. Pt given return precautions    Glendell Docker, NP 03/01/14 0034  Johnna Acosta, MD 03/04/14 [phone]

## 2014-03-01 NOTE — ED Notes (Signed)
Pt having neck and upper back pain since yesterday.  No injury.

## 2014-03-01 NOTE — Discharge Instructions (Signed)
Heat Therapy Heat therapy can help ease sore, stiff, injured, and tight muscles and joints. Heat relaxes your muscles, which may help ease your pain.  RISKS AND COMPLICATIONS If you have any of the following conditions, do not use heat therapy unless your health care provider has approved:  Poor circulation.  Healing wounds or scarred skin in the area being treated.  Diabetes, heart disease, or high blood pressure.  Not being able to feel (numbness) the area being treated.  Unusual swelling of the area being treated.  Active infections.  Blood clots.  Cancer.  Inability to communicate pain. This may include young children and people who have problems with their brain function (dementia).  Pregnancy. Heat therapy should only be used on old, pre-existing, or long-lasting (chronic) injuries. Do not use heat therapy on new injuries unless directed by your health care provider. HOW TO USE HEAT THERAPY There are several different kinds of heat therapy, including:  Moist heat pack.  Warm water bath.  Hot water bottle.  Electric heating pad.  Heated gel pack.  Heated wrap.  Electric heating pad. Use the heat therapy method suggested by your health care provider. Follow your health care provider's instructions on when and how to use heat therapy. GENERAL HEAT THERAPY RECOMMENDATIONS  Do not sleep while using heat therapy. Only use heat therapy while you are awake.  Your skin may turn pink while using heat therapy. Do not use heat therapy if your skin turns red.  Do not use heat therapy if you have new pain.  High heat or long exposure to heat can cause burns. Be careful when using heat therapy to avoid burning your skin.  Do not use heat therapy on areas of your skin that are already irritated, such as with a rash or sunburn. SEEK MEDICAL CARE IF:  You have blisters, redness, swelling, or numbness.  You have new pain.  Your pain is worse. MAKE SURE  YOU:  Understand these instructions.  Will watch your condition.  Will get help right away if you are not doing well or get worse. Document Released: 04/02/2011 Document Revised: 05/25/2013 Document Reviewed: 03/03/2013 [facility] Patient Information 2015 [place], [place]. This information is not intended to replace advice given to you by your health care provider. Make sure you discuss any questions you have with your health care provider.  Muscle Cramps and Spasms Muscle cramps and spasms occur when a muscle or muscles tighten and you have no control over this tightening (involuntary muscle contraction). They are a common problem and can develop in any muscle. The most common place is in the calf muscles of the leg. Both muscle cramps and muscle spasms are involuntary muscle contractions, but they also have differences:   Muscle cramps are sporadic and painful. They may last a few seconds to a quarter of an hour. Muscle cramps are often more forceful and last longer than muscle spasms.  Muscle spasms may or may not be painful. They may also last just a few seconds or much longer. CAUSES  It is uncommon for cramps or spasms to be due to a serious underlying problem. In many cases, the cause of cramps or spasms is unknown. Some common causes are:   Overexertion.   Overuse from repetitive motions (doing the same thing over and over).   Remaining in a certain position for a long period of time.   Improper preparation, form, or technique while performing a sport or activity.   Dehydration.   Injury.  Side effects of some medicines.   Abnormally low levels of the salts and ions in your blood (electrolytes), especially potassium and calcium. This could happen if you are taking water pills (diuretics) or you are pregnant.  Some underlying medical problems can make it more likely to develop cramps or spasms. These include, but are not limited to:   Diabetes.   Parkinson disease.    Hormone disorders, such as thyroid problems.   Alcohol abuse.   Diseases specific to muscles, joints, and bones.   Blood vessel disease where not enough blood is getting to the muscles.  HOME CARE INSTRUCTIONS   Stay well hydrated. Drink enough water and fluids to keep your urine clear or pale yellow.  It may be helpful to massage, stretch, and relax the affected muscle.  For tight or tense muscles, use a warm towel, heating pad, or hot shower water directed to the affected area.  If you are sore or have pain after a cramp or spasm, applying ice to the affected area may relieve discomfort.  Put ice in a plastic bag.  Place a towel between your skin and the bag.  Leave the ice on for 15-20 minutes, 03-04 times a day.  Medicines used to treat a known cause of cramps or spasms may help reduce their frequency or severity. Only take over-the-counter or prescription medicines as directed by your caregiver. SEEK MEDICAL CARE IF:  Your cramps or spasms get more severe, more frequent, or do not improve over time.  MAKE SURE YOU:   Understand these instructions.  Will watch your condition.  Will get help right away if you are not doing well or get worse. Document Released: 06/30/2001 Document Revised: 05/05/2012 Document Reviewed: 12/26/2011 [facility] Patient Information 2015 [place], [place]. This information is not intended to replace advice given to you by your health care provider. Make sure you discuss any questions you have with your health care provider.

## 2014-04-10 ENCOUNTER — Encounter (HOSPITAL_BASED_OUTPATIENT_CLINIC_OR_DEPARTMENT_OTHER): Payer: Self-pay | Admitting: *Deleted

## 2014-04-10 ENCOUNTER — Emergency Department (HOSPITAL_BASED_OUTPATIENT_CLINIC_OR_DEPARTMENT_OTHER)
Admission: EM | Admit: 2014-04-10 | Discharge: 2014-04-10 | Disposition: A | Discharge status: Home / Self Care | Payer: Medicaid Other | Attending: Emergency Medicine | Admitting: Emergency Medicine

## 2014-04-10 DIAGNOSIS — Z3202 Encounter for pregnancy test, result negative: Secondary | ICD-10-CM | POA: Diagnosis not present

## 2014-04-10 DIAGNOSIS — Z720 Tobacco use: Secondary | ICD-10-CM | POA: Insufficient documentation

## 2014-04-10 DIAGNOSIS — R300 Dysuria: Secondary | ICD-10-CM | POA: Diagnosis present

## 2014-04-10 DIAGNOSIS — N3090 Cystitis, unspecified without hematuria: Secondary | ICD-10-CM | POA: Diagnosis not present

## 2014-04-10 DIAGNOSIS — N760 Acute vaginitis: Secondary | ICD-10-CM | POA: Insufficient documentation

## 2014-04-10 DIAGNOSIS — B9689 Other specified bacterial agents as the cause of diseases classified elsewhere: Secondary | ICD-10-CM

## 2014-04-10 DIAGNOSIS — N3091 Cystitis, unspecified with hematuria: Secondary | ICD-10-CM

## 2014-04-10 LAB — URINALYSIS, ROUTINE W REFLEX MICROSCOPIC
Bilirubin Urine: NEGATIVE
GLUCOSE, UA: NEGATIVE mg/dL
Ketones, ur: NEGATIVE mg/dL
Nitrite: NEGATIVE
PROTEIN: 30 mg/dL — AB
Specific Gravity, Urine: 1.026 (ref 1.005–1.030)
UROBILINOGEN UA: 1 mg/dL (ref 0.0–1.0)
pH: 6.5 (ref 5.0–8.0)

## 2014-04-10 LAB — URINE MICROSCOPIC-ADD ON

## 2014-04-10 LAB — WET PREP, GENITAL
Trich, Wet Prep: NONE SEEN
Yeast Wet Prep HPF POC: NONE SEEN

## 2014-04-10 LAB — PREGNANCY, URINE: Preg Test, Ur: NEGATIVE

## 2014-04-10 MED ORDER — LIDOCAINE HCL (PF) 1 % IJ SOLN
INTRAMUSCULAR | Status: AC
  Administered 2014-04-10: 5 mL
  Filled 2014-04-10: qty 5

## 2014-04-10 MED ORDER — METRONIDAZOLE 500 MG PO TABS: 500.0000 mg | ORAL_TABLET | Freq: Two times a day (BID) | ORAL | Status: DC

## 2014-04-10 MED ORDER — CEPHALEXIN 500 MG PO CAPS: 500.0000 mg | ORAL_CAPSULE | Freq: Two times a day (BID) | ORAL | Status: DC

## 2014-04-10 MED ORDER — CEFTRIAXONE SODIUM 250 MG IJ SOLR
250.0000 mg | Freq: Once | INTRAMUSCULAR | Status: AC
  Administered 2014-04-10: 250 mg via INTRAMUSCULAR
  Filled 2014-04-10: qty 250

## 2014-04-10 MED ORDER — AZITHROMYCIN 250 MG PO TABS
1000.0000 mg | ORAL_TABLET | Freq: Once | ORAL | Status: AC
  Administered 2014-04-10: 1000 mg via ORAL
  Filled 2014-04-10: qty 4

## 2014-04-10 NOTE — ED Notes (Signed)
Pt c/o dysuria since this morning with some vaginal bleeding- pt has implant birth control- reports sex 2 days ago was painful

## 2014-04-10 NOTE — ED Provider Notes (Signed)
CSN: [id number]     Arrival date & time 04/10/14  1507 History   First MD Initiated Contact with Patient 04/10/14 1729     Chief Complaint  Patient presents with  . Dysuria     (Consider location/radiation/quality/duration/timing/severity/associated sxs/prior Treatment) HPI Comments: Patient is a G3P3 29 yo F presenting to the ED for evaluation of vaginal bleeding and dysuria that began this morning. Patient states she had dyspareunia two days ago prior to the onset of symptoms. No modifying factors identified. Patient stated prior to arrival. Denies any fevers, chills, nausea, vomiting, diarrhea, vaginal discharge. Abdominal surgical history includes 2 cesarean sections. Patient is currently on the Nexplanon, no withdrawal bleeding.   Patient is a 29 y.o. female presenting with dysuria.  Dysuria   History reviewed. No pertinent past medical history. Past Surgical History  Procedure Laterality Date  . Cesarean section    . Cesarean section    . Colposcopy     Family History  Problem Relation Age of Onset  . Diabetes Father   . Dementia Maternal Grandmother   . Diabetes Maternal Grandfather   . Diabetes Paternal Grandmother    History  Substance Use Topics  . Smoking status: Current Every Day Smoker -- 0.50 packs/day for 14 years    Types: Cigarettes  . Smokeless tobacco: Never Used  . Alcohol Use: No   OB History    Gravida Para Term Preterm AB TAB SAB Ectopic Multiple Living   3 3 3       3      Review of Systems  Genitourinary: Positive for dysuria and vaginal bleeding.  All other systems reviewed and are negative.     Allergies  Percocet  Home Medications   Prior to Admission medications   Medication Sig Start Date End Date Taking? Authorizing Provider  cephALEXin (KEFLEX) 500 MG capsule Take 1 capsule (500 mg total) by mouth 2 (two) times daily. 04/10/14   Teaira Croft, PA-C  cyclobenzaprine (FLEXERIL) 5 MG tablet Take 1 tablet (5 mg total) by mouth  2 (two) times daily as needed. 03/01/14   Glendell Docker, NP  etonogestrel (NEXPLANON) 68 MG IMPL implant 1 each by Subdermal route once.    Historical Provider, MD  HYDROcodone-acetaminophen (NORCO/VICODIN) 5-325 MG per tablet Take 1-2 tablets by mouth every 4 (four) hours as needed. 03/01/14   Glendell Docker, NP  metroNIDAZOLE (FLAGYL) 500 MG tablet Take 1 tablet (500 mg total) by mouth 2 (two) times daily. 04/10/14   Jasani Dolney, PA-C   BP 125/67 mmHg  Pulse 66  Temp(Src) 98.3 F (36.8 C) (Oral)  Resp 18  Ht 5\' 5"  (1.651 m)  Wt 230 lb (104.327 kg)  BMI 38.27 kg/m2  SpO2 98% Physical Exam  Constitutional: She is oriented to person, place, and time. She appears well-developed and well-nourished. No distress.  HENT:  Head: Normocephalic and atraumatic.  Right Ear: External ear normal.  Left Ear: External ear normal.  Nose: Nose normal.  Mouth/Throat: Oropharynx is clear and moist.  Eyes: Conjunctivae are normal.  Neck: Normal range of motion. Neck supple.  Cardiovascular: Normal rate, regular rhythm and normal heart sounds.   Pulmonary/Chest: Effort normal and breath sounds normal. No respiratory distress.  Abdominal: Soft. Normal appearance and bowel sounds are normal. There is no tenderness. There is no rigidity, no rebound, no guarding and no CVA tenderness.  Musculoskeletal: Normal range of motion.  Neurological: She is alert and oriented to person, place, and time.  Skin: Skin is warm  and dry. She is not diaphoretic.  Psychiatric: She has a normal mood and affect.  Nursing note and vitals reviewed.    Exam performed by Baron Sane L,  exam chaperoned Date: 04/10/2014 Pelvic exam: normal external genitalia without evidence of trauma. VULVA: normal appearing vulva with no masses, tenderness or lesion. VAGINA: normal appearing vagina with normal color and discharge, no lesions. CERVIX: normal appearing cervix without lesions, cervical motion tenderness  absent, cervical os closed with out purulent discharge; vaginal discharge - white, Wet prep and DNA probe for chlamydia and GC obtained.   ADNEXA: normal adnexa in size, nontender and no masses UTERUS: uterus is normal size, shape, consistency and nontender.   ED Course  Procedures (including critical care time) Medications  azithromycin (ZITHROMAX) tablet 1,000 mg (1,000 mg Oral Given 04/10/14 1924)  cefTRIAXone (ROCEPHIN) injection 250 mg (250 mg Intramuscular Given 04/10/14 1925)  lidocaine (PF) (XYLOCAINE) 1 % injection (5 mLs  Given 04/10/14 1925)    Labs Review Labs Reviewed  WET PREP, GENITAL - Abnormal; Notable for the following:    Clue Cells Wet Prep HPF POC FEW (*)    WBC, Wet Prep HPF POC FEW (*)    All other components within normal limits  URINALYSIS, ROUTINE W REFLEX MICROSCOPIC - Abnormal; Notable for the following:    APPearance CLOUDY (*)    Hgb urine dipstick LARGE (*)    Protein, ur 30 (*)    Leukocytes, UA LARGE (*)    All other components within normal limits  URINE MICROSCOPIC-ADD ON - Abnormal; Notable for the following:    Bacteria, UA FEW (*)    All other components within normal limits  PREGNANCY, URINE  GC/CHLAMYDIA PROBE AMP ([place])    Imaging Review No results found.   EKG Interpretation None      MDM   Final diagnoses:  Hemorrhagic cystitis  Bacterial vaginosis   Filed Vitals:   04/10/14 1930  BP: 125/67  Pulse: 66  Temp:   Resp: 18   Afebrile, NAD, non-toxic appearing, AAOx4.  I have reviewed nursing notes, vital signs, and all appropriate lab and imaging results for this patient.  1) Cystitis: Pt has been diagnosed with a UTI. Pt is afebrile, no CVA tenderness, normotensive, and denies N/V. Pt to be dc home with antibiotics and instructions to follow up with PCP if symptoms persist.  2) BV: Patient to be discharged with instructions to follow up with OBGYN. Pt understands GC/Chlamydia cultures pending and that they will need  to inform all sexual partners within the last 6 months if results return positive. Pt has been treated prophylacticly with azithromycin and rocephin due to pts history, pelvic exam, and wet prep with increased WBCs. Pt advised that she will receive a call in 48 hours if the test is positive and to refrain from sexual activity for 48 hours. If the test is positive, pt is advised to refrain from sexual activity for 10 days for the medicine to take effect.  Pt not concerning for PID because hemodynamically stable and no cervical motion tenderness on pelvic exam. Pt has also been treated with flagyl for Bacterial Vaginosis. Pt has been advised to not drink alcohol while on this medication.   Discussed that because pt has had recent unprotected sex, might want to consider getting tested for HIV as well. Counseled pt that latex condoms are the only way to prevent against STDs or HIV.   Patient is stable at time of discharge  Baron Sane, PA-C 04/11/14 [place], MD 04/11/14 1151

## 2014-04-10 NOTE — Discharge Instructions (Signed)
Please follow up with your primary care physician in 1-2 days. If you do not have one please call the [facility] number listed above. Please take your antibiotic until completion. Please read all discharge instructions and return precautions.    Bacterial Vaginosis Bacterial vaginosis is a vaginal infection that occurs when the normal balance of bacteria in the vagina is disrupted. It results from an overgrowth of certain bacteria. This is the most common vaginal infection in women of childbearing age. Treatment is important to prevent complications, especially in pregnant women, as it can cause a premature delivery. CAUSES  Bacterial vaginosis is caused by an increase in harmful bacteria that are normally present in smaller amounts in the vagina. Several different kinds of bacteria can cause bacterial vaginosis. However, the reason that the condition develops is not fully understood. RISK FACTORS Certain activities or behaviors can put you at an increased risk of developing bacterial vaginosis, including:  Having a new sex partner or multiple sex partners.  Douching.  Using an intrauterine device (IUD) for contraception. Women do not get bacterial vaginosis from toilet seats, bedding, swimming pools, or contact with objects around them. SIGNS AND SYMPTOMS  Some women with bacterial vaginosis have no signs or symptoms. Common symptoms include:  Grey vaginal discharge.  A fishlike odor with discharge, especially after sexual intercourse.  Itching or burning of the vagina and vulva.  Burning or pain with urination. DIAGNOSIS  Your health care provider will take a medical history and examine the vagina for signs of bacterial vaginosis. A sample of vaginal fluid may be taken. Your health care provider will look at this sample under a microscope to check for bacteria and abnormal cells. A vaginal pH test may also be done.  TREATMENT  Bacterial vaginosis may be treated  with antibiotic medicines. These may be given in the form of a pill or a vaginal cream. A second round of antibiotics may be prescribed if the condition comes back after treatment.  HOME CARE INSTRUCTIONS   Only take over-the-counter or prescription medicines as directed by your health care provider.  If antibiotic medicine was prescribed, take it as directed. Make sure you finish it even if you start to feel better.  Do not have sex until treatment is completed.  Tell all sexual partners that you have a vaginal infection. They should see their health care provider and be treated if they have problems, such as a mild rash or itching.  Practice safe sex by using condoms and only having one sex partner. SEEK MEDICAL CARE IF:   Your symptoms are not improving after 3 days of treatment.  You have increased discharge or pain.  You have a fever. MAKE SURE YOU:   Understand these instructions.  Will watch your condition.  Will get help right away if you are not doing well or get worse. FOR MORE INFORMATION  Centers for Disease Control and Prevention, Division of STD Prevention: [employer] American Sexual Health Association (ASHA): www.ashastd.org  Document Released: 01/08/2005 Document Revised: 10/29/2012 Document Reviewed: 08/20/2012 [facility] Patient Information 2015 [place], [place]. This information is not intended to replace advice given to you by your health care provider. Make sure you discuss any questions you have with your health care provider. Urinary Tract Infection Urinary tract infections (UTIs) can develop anywhere along your urinary tract. Your urinary tract is your body's drainage system for removing wastes and extra water. Your urinary tract includes two kidneys, two ureters, a bladder, and a  urethra. Your kidneys are a pair of bean-shaped organs. Each kidney is about the size of your fist. They are located below your ribs, one on each side of your  spine. CAUSES Infections are caused by microbes, which are microscopic organisms, including fungi, viruses, and bacteria. These organisms are so small that they can only be seen through a microscope. Bacteria are the microbes that most commonly cause UTIs. SYMPTOMS  Symptoms of UTIs may vary by age and gender of the patient and by the location of the infection. Symptoms in young women typically include a frequent and intense urge to urinate and a painful, burning feeling in the bladder or urethra during urination. Older women and men are more likely to be tired, shaky, and weak and have muscle aches and abdominal pain. A fever may mean the infection is in your kidneys. Other symptoms of a kidney infection include pain in your back or sides below the ribs, nausea, and vomiting. DIAGNOSIS To diagnose a UTI, your caregiver will ask you about your symptoms. Your caregiver also will ask to provide a urine sample. The urine sample will be tested for bacteria and white blood cells. White blood cells are made by your body to help fight infection. TREATMENT  Typically, UTIs can be treated with medication. Because most UTIs are caused by a bacterial infection, they usually can be treated with the use of antibiotics. The choice of antibiotic and length of treatment depend on your symptoms and the type of bacteria causing your infection. HOME CARE INSTRUCTIONS  If you were prescribed antibiotics, take them exactly as your caregiver instructs you. Finish the medication even if you feel better after you have only taken some of the medication.  Drink enough water and fluids to keep your urine clear or pale yellow.  Avoid caffeine, tea, and carbonated beverages. They tend to irritate your bladder.  Empty your bladder often. Avoid holding urine for long periods of time.  Empty your bladder before and after sexual intercourse.  After a bowel movement, women should cleanse from front to back. Use each tissue only  once. SEEK MEDICAL CARE IF:   You have back pain.  You develop a fever.  Your symptoms do not begin to resolve within 3 days. SEEK IMMEDIATE MEDICAL CARE IF:   You have severe back pain or lower abdominal pain.  You develop chills.  You have nausea or vomiting.  You have continued burning or discomfort with urination. MAKE SURE YOU:   Understand these instructions.  Will watch your condition.  Will get help right away if you are not doing well or get worse. Document Released: 10/18/2004 Document Revised: 07/10/2011 Document Reviewed: 02/16/2011 [facility] Patient Information 2015 [place], [place]. This information is not intended to replace advice given to you by your health care provider. Make sure you discuss any questions you have with your health care provider.

## 2014-04-13 LAB — GC/CHLAMYDIA PROBE AMP (~~LOC~~) NOT AT ARMC
Chlamydia: POSITIVE — AB
Neisseria Gonorrhea: NEGATIVE

## 2014-04-14 ENCOUNTER — Telehealth (HOSPITAL_COMMUNITY): Payer: Self-pay

## 2014-04-14 NOTE — ED Notes (Signed)
Positive for chlamydia- treated per protocol. Attempted to contact x 1

## 2014-04-15 ENCOUNTER — Telehealth (HOSPITAL_BASED_OUTPATIENT_CLINIC_OR_DEPARTMENT_OTHER): Payer: Self-pay | Admitting: *Deleted

## 2014-05-13 ENCOUNTER — Ambulatory Visit: Payer: Medicaid Other | Admitting: Certified Nurse Midwife

## 2014-05-18 ENCOUNTER — Ambulatory Visit: Payer: Medicaid Other | Admitting: Certified Nurse Midwife

## 2014-07-17 ENCOUNTER — Encounter (HOSPITAL_COMMUNITY): Payer: Self-pay | Admitting: *Deleted

## 2014-07-17 ENCOUNTER — Inpatient Hospital Stay (HOSPITAL_COMMUNITY)
Admission: AD | Admit: 2014-07-17 | Discharge: 2014-07-18 | Disposition: A | Discharge status: Home / Self Care | Payer: Medicaid Other | Source: Ambulatory Visit | Attending: Obstetrics | Admitting: Obstetrics

## 2014-07-17 DIAGNOSIS — F17210 Nicotine dependence, cigarettes, uncomplicated: Secondary | ICD-10-CM | POA: Insufficient documentation

## 2014-07-17 DIAGNOSIS — B3731 Acute candidiasis of vulva and vagina: Secondary | ICD-10-CM

## 2014-07-17 DIAGNOSIS — B373 Candidiasis of vulva and vagina: Secondary | ICD-10-CM | POA: Diagnosis not present

## 2014-07-17 DIAGNOSIS — L293 Anogenital pruritus, unspecified: Secondary | ICD-10-CM | POA: Diagnosis present

## 2014-07-17 DIAGNOSIS — Z8619 Personal history of other infectious and parasitic diseases: Secondary | ICD-10-CM

## 2014-07-17 HISTORY — DX: Trichomoniasis, unspecified: A59.9

## 2014-07-17 HISTORY — DX: Chlamydial infection, unspecified: A74.9

## 2014-07-17 HISTORY — DX: Unspecified abnormal cytological findings in specimens from vagina: R87.629

## 2014-07-17 LAB — URINE MICROSCOPIC-ADD ON

## 2014-07-17 LAB — URINALYSIS, ROUTINE W REFLEX MICROSCOPIC
Bilirubin Urine: NEGATIVE
Glucose, UA: NEGATIVE mg/dL
Hgb urine dipstick: NEGATIVE
Ketones, ur: 15 mg/dL — AB
NITRITE: NEGATIVE
PH: 6 (ref 5.0–8.0)
PROTEIN: NEGATIVE mg/dL
SPECIFIC GRAVITY, URINE: 1.025 (ref 1.005–1.030)
Urobilinogen, UA: 1 mg/dL (ref 0.0–1.0)

## 2014-07-17 LAB — WET PREP, GENITAL
Clue Cells Wet Prep HPF POC: NONE SEEN
Trich, Wet Prep: NONE SEEN

## 2014-07-17 LAB — POCT PREGNANCY, URINE: Preg Test, Ur: NEGATIVE

## 2014-07-17 NOTE — MAU Provider Note (Signed)
History     CSN: [id number]  Arrival date and time: 07/17/14 2232   First Provider Initiated Contact with Patient 07/17/14 2312      Chief Complaint  Patient presents with  . Vaginal Itching   This is a 29 y.o. female who presents with c/o vaginal itching and irritation for 2 days. States just switched to a new soap and is thinking it may be the cause. Does have a new partner for sex but uses condoms with him    DId get treated for chlamydia in March, but that was with a different partner. Denies lesions or ulcers. Does have small bumps on labia but thought they were from shaving. Denies problems with bleeding or other somatic symptoms.   Has an appointment for annual exam with Dr Jodi Mourning next week.   Vaginal Discharge The patient's primary symptoms include genital itching and vaginal discharge. The patient's pertinent negatives include no genital lesions, genital odor, pelvic pain or vaginal bleeding. This is a new problem. The current episode started in the past 7 days. The patient is experiencing no pain. She is not pregnant. Pertinent negatives include no abdominal pain, back pain, chills, dysuria, fever, frequency, nausea, urgency or vomiting. The vaginal discharge was normal. There has been no bleeding. She has not been passing clots. She has not been passing tissue.   RN Note:  Expand All Collapse All   Pt unsure if she is reacting to soap or an STD. C/O vag itching and irritation x 2 days. Had intercourse with new partner yesterday and she said it did not "feel right"          OB History    Gravida Para Term Preterm AB TAB SAB Ectopic Multiple Living   3 3 3       3       Past Medical History  Diagnosis Date  . Vaginal Pap smear, abnormal   . Chlamydia   . Trichomonas infection     Past Surgical History  Procedure Laterality Date  . Cesarean section    . Cesarean section    . Colposcopy      Family History  Problem Relation Age of Onset  . Diabetes Father     . Dementia Maternal Grandmother   . Diabetes Maternal Grandfather   . Diabetes Paternal Grandmother     History  Substance Use Topics  . Smoking status: Current Every Day Smoker -- 0.50 packs/day for 14 years    Types: Cigarettes  . Smokeless tobacco: Never Used  . Alcohol Use: No    Allergies:  Allergies  Allergen Reactions  . Percocet [Oxycodone-Acetaminophen] Itching    Prescriptions prior to admission  Medication Sig Dispense Refill Last Dose  . cephALEXin (KEFLEX) 500 MG capsule Take 1 capsule (500 mg total) by mouth 2 (two) times daily. 20 capsule 0   . cyclobenzaprine (FLEXERIL) 5 MG tablet Take 1 tablet (5 mg total) by mouth 2 (two) times daily as needed. 20 tablet 0   . etonogestrel (NEXPLANON) 68 MG IMPL implant 1 each by Subdermal route once.     Marland Kitchen HYDROcodone-acetaminophen (NORCO/VICODIN) 5-325 MG per tablet Take 1-2 tablets by mouth every 4 (four) hours as needed. 15 tablet 0   . metroNIDAZOLE (FLAGYL) 500 MG tablet Take 1 tablet (500 mg total) by mouth 2 (two) times daily. 14 tablet 0     Review of Systems  Constitutional: Negative for fever, chills and malaise/fatigue.  Gastrointestinal: Negative for nausea, vomiting and abdominal pain.  Genitourinary: Positive for vaginal discharge. Negative for dysuria, urgency, frequency and pelvic pain.       Vaginal itching and irritation  Musculoskeletal: Negative for myalgias and back pain.  Neurological: Negative for dizziness.   Physical Exam   Blood pressure 128/70, pulse 66, temperature 98.3 F (36.8 C), temperature source Oral, resp. rate 18, height 5\' 6"  (1.676 m), weight 254 lb 9.6 oz (115.486 kg).  Physical Exam  Constitutional: She is oriented to person, place, and time. She appears well-developed and well-nourished. No distress.  HENT:  Head: Normocephalic.  Cardiovascular: Normal rate.   Respiratory: Effort normal. No respiratory distress.  GI: Soft. She exhibits no distension. There is no tenderness.  There is no rebound and no guarding.  Genitourinary:    Vaginal discharge (thin white) found.  Two very small (<76mm) white smooth edged papules, appear like either sebaceous cysts or possibly molluscum  There is mild erethema on vulva  No other lesions  Musculoskeletal: Normal range of motion.  Neurological: She is alert and oriented to person, place, and time.  Skin: Skin is warm and dry.  Psychiatric: She has a normal mood and affect.    MAU Course  Procedures  MDM Results for orders placed or performed during the hospital encounter of 07/17/14 (from the past 72 hour(s))  Urinalysis, Routine w reflex microscopic (not at [facility])     Status: Abnormal   Collection Time: 07/17/14 11:00 PM  Result Value Ref Range   Color, Urine YELLOW YELLOW   APPearance CLEAR CLEAR   Specific Gravity, Urine 1.025 1.005 - 1.030   pH 6.0 5.0 - 8.0   Glucose, UA NEGATIVE NEGATIVE mg/dL   Hgb urine dipstick NEGATIVE NEGATIVE   Bilirubin Urine NEGATIVE NEGATIVE   Ketones, ur 15 (A) NEGATIVE mg/dL   Protein, ur NEGATIVE NEGATIVE mg/dL   Urobilinogen, UA 1.0 0.0 - 1.0 mg/dL   Nitrite NEGATIVE NEGATIVE   Leukocytes, UA SMALL (A) NEGATIVE  Urine microscopic-add on     Status: Abnormal   Collection Time: 07/17/14 11:00 PM  Result Value Ref Range   Squamous Epithelial / LPF RARE RARE   WBC, UA 3-6 <3 WBC/hpf   RBC / HPF 0-2 <3 RBC/hpf   Bacteria, UA FEW (A) RARE  Pregnancy, urine POC     Status: None   Collection Time: 07/17/14 11:10 PM  Result Value Ref Range   Preg Test, Ur NEGATIVE NEGATIVE    Comment:        THE SENSITIVITY OF THIS METHODOLOGY IS >24 mIU/mL   Wet prep, genital     Status: Abnormal   Collection Time: 07/17/14 11:35 PM  Result Value Ref Range   Yeast Wet Prep HPF POC FEW (A) NONE SEEN   Trich, Wet Prep NONE SEEN NONE SEEN   Clue Cells Wet Prep HPF POC NONE SEEN NONE SEEN   WBC, Wet Prep HPF POC MODERATE (A) NONE SEEN    Comment: MODERATE BACTERIA SEEN     Assessment  and Plan  A:  Vulvar irritation and itching       Yeast vaginitis       Possible exposure to STD, hx of STDs, requested STD testing  P:  Discussed results of wet prep      Advised it will be 2 days before GC/Chlamydia results come back       Advised continue condom use      Rx Diflucan with refills      If no relief in 4-5 days,  may repeat dose       Follow up as scheduled with Dr Jodi Mourning for Annual Exam  [facility] 07/17/2014, 11:12 PM

## 2014-07-17 NOTE — MAU Note (Addendum)
Pt unsure if she is reacting to soap or an STD. C/O vag itching and irritation x 2 days. Had intercourse with new partner yesterday and she said it did not "feel right"

## 2014-07-18 DIAGNOSIS — B373 Candidiasis of vulva and vagina: Secondary | ICD-10-CM

## 2014-07-18 MED ORDER — FLUCONAZOLE 150 MG PO TABS: 150.0000 mg | ORAL_TABLET | Freq: Once | ORAL | Status: DC

## 2014-07-18 NOTE — Discharge Instructions (Signed)

## 2014-07-19 ENCOUNTER — Telehealth: Payer: Self-pay | Admitting: *Deleted

## 2014-07-19 ENCOUNTER — Other Ambulatory Visit: Payer: Self-pay | Admitting: Obstetrics

## 2014-07-19 DIAGNOSIS — B373 Candidiasis of vulva and vagina: Secondary | ICD-10-CM

## 2014-07-19 DIAGNOSIS — B3731 Acute candidiasis of vulva and vagina: Secondary | ICD-10-CM

## 2014-07-19 LAB — GC/CHLAMYDIA PROBE AMP (~~LOC~~) NOT AT ARMC
CHLAMYDIA, DNA PROBE: NEGATIVE
Neisseria Gonorrhea: NEGATIVE

## 2014-07-19 MED ORDER — TERCONAZOLE 0.4 % VA CREA: 1.0000 | TOPICAL_CREAM | Freq: Every day | VAGINAL | Status: DC

## 2014-07-19 NOTE — Telephone Encounter (Signed)
11:30 Per Dr Jodi Mourning- Rx for [facility] sent to pharmacy. Patient notified.

## 2014-07-19 NOTE — Telephone Encounter (Signed)
Patient states she was seen at [facility]U this week end for yeast and treated with Diflucan. She is still having symptoms. 11:30

## 2014-07-29 ENCOUNTER — Encounter: Payer: Self-pay | Admitting: Certified Nurse Midwife

## 2014-07-29 ENCOUNTER — Telehealth: Payer: Self-pay

## 2014-07-29 ENCOUNTER — Ambulatory Visit (INDEPENDENT_AMBULATORY_CARE_PROVIDER_SITE_OTHER): Payer: Medicaid Other | Admitting: Certified Nurse Midwife

## 2014-07-29 VITALS — BP 102/65 | HR 61 | Temp 98.6°F | Ht 65.0 in | Wt 255.0 lb

## 2014-07-29 DIAGNOSIS — L732 Hidradenitis suppurativa: Secondary | ICD-10-CM

## 2014-07-29 DIAGNOSIS — H9313 Tinnitus, bilateral: Secondary | ICD-10-CM

## 2014-07-29 DIAGNOSIS — Z113 Encounter for screening for infections with a predominantly sexual mode of transmission: Secondary | ICD-10-CM

## 2014-07-29 DIAGNOSIS — Z01419 Encounter for gynecological examination (general) (routine) without abnormal findings: Secondary | ICD-10-CM | POA: Diagnosis not present

## 2014-07-29 DIAGNOSIS — Z0000 Encounter for general adult medical examination without abnormal findings: Secondary | ICD-10-CM

## 2014-07-29 DIAGNOSIS — E669 Obesity, unspecified: Secondary | ICD-10-CM

## 2014-07-29 LAB — CBC WITH DIFFERENTIAL/PLATELET
Basophils Absolute: 0 10*3/uL (ref 0.0–0.1)
Basophils Relative: 0 % (ref 0–1)
EOS PCT: 1 % (ref 0–5)
Eosinophils Absolute: 0.1 10*3/uL (ref 0.0–0.7)
HCT: 40.8 % (ref 36.0–46.0)
Hemoglobin: 13.5 g/dL (ref 12.0–15.0)
Lymphocytes Relative: 35 % (ref 12–46)
Lymphs Abs: 3.9 10*3/uL (ref 0.7–4.0)
MCH: 29.9 pg (ref 26.0–34.0)
MCHC: 33.1 g/dL (ref 30.0–36.0)
MCV: 90.3 fL (ref 78.0–100.0)
MONOS PCT: 9 % (ref 3–12)
MPV: 9.5 fL (ref 8.6–12.4)
Monocytes Absolute: 1 10*3/uL (ref 0.1–1.0)
Neutro Abs: 6.1 10*3/uL (ref 1.7–7.7)
Neutrophils Relative %: 55 % (ref 43–77)
Platelets: 301 10*3/uL (ref 150–400)
RBC: 4.52 MIL/uL (ref 3.87–5.11)
RDW: 12.9 % (ref 11.5–15.5)
WBC: 11.1 10*3/uL — ABNORMAL HIGH (ref 4.0–10.5)

## 2014-07-29 LAB — COMPREHENSIVE METABOLIC PANEL
ALT: 23 U/L (ref 0–35)
AST: 15 U/L (ref 0–37)
Albumin: 4 g/dL (ref 3.5–5.2)
Alkaline Phosphatase: 100 U/L (ref 39–117)
BUN: 9 mg/dL (ref 6–23)
CO2: 23 mEq/L (ref 19–32)
CREATININE: 0.72 mg/dL (ref 0.50–1.10)
Calcium: 9.1 mg/dL (ref 8.4–10.5)
Chloride: 106 mEq/L (ref 96–112)
GLUCOSE: 87 mg/dL (ref 70–99)
Potassium: 4.7 mEq/L (ref 3.5–5.3)
SODIUM: 138 meq/L (ref 135–145)
TOTAL PROTEIN: 6.8 g/dL (ref 6.0–8.3)
Total Bilirubin: 0.4 mg/dL (ref 0.2–1.2)

## 2014-07-29 LAB — TRIGLYCERIDES: Triglycerides: 95 mg/dL (ref <150)

## 2014-07-29 LAB — CHOLESTEROL, TOTAL: CHOLESTEROL: 204 mg/dL — AB (ref 0–200)

## 2014-07-29 LAB — TSH: TSH: 1.131 u[IU]/mL (ref 0.350–4.500)

## 2014-07-29 LAB — HEMOGLOBIN A1C
Hgb A1c MFr Bld: 5.5 % (ref <5.7)
Mean Plasma Glucose: 111 mg/dL (ref <117)

## 2014-07-29 LAB — HEPATITIS B SURFACE ANTIGEN: HEP B S AG: NEGATIVE

## 2014-07-29 LAB — HEPATITIS C ANTIBODY: HCV AB: NEGATIVE

## 2014-07-29 LAB — HIV ANTIBODY (ROUTINE TESTING W REFLEX): HIV 1&2 Ab, 4th Generation: NONREACTIVE

## 2014-07-29 LAB — HDL CHOLESTEROL: HDL: 39 mg/dL — AB (ref >=46)

## 2014-07-29 NOTE — Addendum Note (Signed)
Addended by: Lewie Loron D on: 07/29/2014 03:00 PM   Modules accepted: Orders

## 2014-07-29 NOTE — Telephone Encounter (Signed)
called patient and left vm regarding several referrals - told her to call me back to clarify appt dates and times

## 2014-07-29 NOTE — Progress Notes (Signed)
Patient ID: Lisa Wright, female   DOB: February 07, 1985, 29 y.o.   MRN: [id number]    Subjective:     Lisa Wright is a 29 y.o. female here for a routine exam.  Current complaints: none.  Not having periods d/t Nexplanon.  C/O ringing in the ears for about a year, two of her children have cochlear implants.  Injured her right foot about two weeks ago, healing has small bruise on top of her foot, able to use full range of motion and walk on her foot.  Discussed wearing good supportive shoes while at work along with compression hose, works as a Programmer, applications.  Has seen dermatologist for HS.  Has been exercising in the mornings.  Desires full std screening and PCP referral.      Personal health questionnaire:  Is patient Ashkenazi Jewish, have a family history of breast and/or ovarian cancer: no Is there a family history of uterine cancer diagnosed at age < 45, gastrointestinal cancer, urinary tract cancer, family member who is a Field seismologist syndrome-associated carrier: no Is the patient overweight and hypertensive, family history of diabetes, personal history of gestational diabetes, preeclampsia or PCOS: yes Is patient over 27, have PCOS,  family history of premature CHD under age 22, diabetes, smoke, have hypertension or peripheral artery disease:  Yes, DM & HTN with her dad.  PGM: DM, PGF: DM At any time, has a partner hit, kicked or otherwise hurt or frightened you?: no, not in 5 years Over the past 2 weeks, have you felt down, depressed or hopeless?: no Over the past 2 weeks, have you felt little interest or pleasure in doing things?:has a lot of stressors at the moment   Gynecologic History No LMP recorded. Patient has had an implant. Contraception: Nexplanon Last Pap: 11/20/12. Results were: ASCUS, HPV neg.  Last mammogram: N/A.   Obstetric History OB History  Gravida Para Term Preterm AB SAB TAB Ectopic Multiple Living  3 3 3       3     # Outcome Date GA Lbr Len/2nd Weight Sex Delivery Anes PTL  Lv  3 Term 06/01/09 [redacted]w[redacted]d  5 lb 4 oz (2.381 kg) M CS-LTranv EPI  Y  2 Term 03/11/07 [redacted]w[redacted]d  5 lb 6 oz (2.438 kg) M CS-LTranv EPI  Y  1 Term 04/21/04 [redacted]w[redacted]d  6 lb 10 oz (3.005 kg) M Vag-Spont EPI  Y      Past Medical History  Diagnosis Date  . Vaginal Pap smear, abnormal   . Chlamydia   . Trichomonas infection     Past Surgical History  Procedure Laterality Date  . Cesarean section    . Cesarean section    . Colposcopy       Current outpatient prescriptions:  .  etonogestrel (NEXPLANON) 57 MG IMPL implant, 1 each by Subdermal route once., Disp: , Rfl:  Allergies  Allergen Reactions  . Percocet [Oxycodone-Acetaminophen] Itching    History  Substance Use Topics  . Smoking status: Current Every Day Smoker -- 0.50 packs/day for 14 years    Types: Cigarettes  . Smokeless tobacco: Never Used  . Alcohol Use: No    Family History  Problem Relation Age of Onset  . Diabetes Father   . Dementia Maternal Grandmother   . Diabetes Maternal Grandfather   . Diabetes Paternal Grandmother       Review of Systems  Constitutional: negative for fatigue and weight loss Respiratory: negative for cough and wheezing Cardiovascular: negative for chest pain,  fatigue and palpitations Gastrointestinal: negative for abdominal pain and change in bowel habits Musculoskeletal:negative for myalgias Neurological: negative for gait problems and tremors Behavioral/Psych: negative for abusive relationship, depression Endocrine: negative for temperature intolerance   Genitourinary:negative for abnormal menstrual periods, genital lesions, hot flashes, sexual problems and vaginal discharge Integument/breast: negative for breast lump, breast tenderness, nipple discharge and skin lesion(s)    Objective:       BP 102/65 mmHg  Pulse 61  Temp(Src) 98.6 F (37 C)  Ht 5\' 5"  (1.651 m)  Wt 255 lb (115.667 kg)  BMI 42.43 kg/m2 General:   alert  Skin:  Multiple HS lesions on breasts, under arms and lower  abdomen.    Lungs:   clear to auscultation bilaterally  Heart:   regular rate and rhythm, S1, S2 normal, no murmur, click, rub or gallop  Breasts:   normal without suspicious masses, skin or nipple changes or axillary nodes  Abdomen:  normal findings: no organomegaly, soft, non-tender and no hernia obese  Pelvis:  External genitalia: normal general appearance Urinary system: urethral meatus normal and bladder without fullness, nontender Vaginal: normal without tenderness, induration or masses Cervix: normal appearance Adnexa: normal bimanual exam Uterus: anteverted and non-tender, normal size   Lab Review Urine pregnancy test Labs reviewed yes Radiologic studies reviewed no  50% of 30 min visit spent on counseling and coordination of care.   Assessment:    Healthy female exam.   Tinnitus HS Obesity STD screening   Plan:    Education reviewed: calcium supplements, depression evaluation, low fat, low cholesterol diet, safe sex/STD prevention, self breast exams, skin cancer screening and weight bearing exercise. Contraception: Nexplanon. Follow up in: 1 year.   No orders of the defined types were placed in this encounter.   Orders Placed This Encounter  Procedures  . HIV antibody (with reflex)  . Hepatitis B surface antigen  . RPR  . Hepatitis C antibody  . CBC with Differential/Platelet  . Comprehensive metabolic panel  . TSH  . Cholesterol, total  . Triglycerides  . HDL cholesterol  . Prolactin  . Testosterone, Free, Total, SHBG  . 17-Hydroxyprogesterone  . Progesterone  . Hemoglobin A1c  . Ambulatory referral to Internal Medicine    Referral Priority:  Routine    Referral Type:  Consultation    Referral Reason:  Specialty Services Required    Requested Specialty:  Internal Medicine    Number of Visits Requested:  1  . Ambulatory referral to ENT    Referral Priority:  Routine    Referral Type:  Consultation    Referral Reason:  Specialty Services Required     Requested Specialty:  Otolaryngology    Number of Visits Requested:  1  . Referral to Nutrition and Diabetes Services    Referral Priority:  Routine    Referral Type:  Consultation    Referral Reason:  Specialty Services Required    Number of Visits Requested:  1  . Ambulatory referral to General Surgery    Referral Priority:  Routine    Referral Type:  Surgical    Referral Reason:  Specialty Services Required    Requested Specialty:  General Surgery    Number of Visits Requested:  1

## 2014-07-30 LAB — TESTOSTERONE, FREE, TOTAL, SHBG
Sex Hormone Binding: 26 nmol/L (ref 17–124)
Testosterone, Free: 19.2 pg/mL — ABNORMAL HIGH (ref 0.6–6.8)
Testosterone-% Free: 2.1 % (ref 0.4–2.4)
Testosterone: 92 ng/dL — ABNORMAL HIGH (ref 10–70)

## 2014-07-30 LAB — RPR

## 2014-07-30 LAB — PROGESTERONE: Progesterone: 0.6 ng/mL

## 2014-07-30 LAB — PROLACTIN: Prolactin: 4.3 ng/mL

## 2014-08-01 LAB — SURESWAB, VAGINOSIS/VAGINITIS PLUS
Atopobium vaginae: 5.8 Log (cells/mL)
C. GLABRATA, DNA: NOT DETECTED
C. TROPICALIS, DNA: NOT DETECTED
C. albicans, DNA: NOT DETECTED
C. parapsilosis, DNA: NOT DETECTED
C. trachomatis RNA, TMA: NOT DETECTED
Gardnerella vaginalis: >8 Log (cells/mL)
LACTOBACILLUS SPECIES: NOT DETECTED Log (cells/mL)
MEGASPHAERA SPECIES: 7.9 Log (cells/mL)
N. gonorrhoeae RNA, TMA: NOT DETECTED
T. vaginalis RNA, QL TMA: NOT DETECTED

## 2014-08-02 LAB — PAP IG W/ RFLX HPV ASCU

## 2014-08-02 LAB — 17-HYDROXYPROGESTERONE: 17-OH-Progesterone, LC/MS/MS: 43 ng/dL

## 2014-08-03 ENCOUNTER — Other Ambulatory Visit: Payer: Self-pay | Admitting: Certified Nurse Midwife

## 2014-08-03 DIAGNOSIS — B9689 Other specified bacterial agents as the cause of diseases classified elsewhere: Secondary | ICD-10-CM

## 2014-08-03 DIAGNOSIS — N760 Acute vaginitis: Principal | ICD-10-CM

## 2014-08-03 MED ORDER — TINIDAZOLE 500 MG PO TABS: 2.0000 g | ORAL_TABLET | Freq: Every day | ORAL | Status: AC

## 2014-08-17 ENCOUNTER — Other Ambulatory Visit: Payer: Self-pay | Admitting: Certified Nurse Midwife

## 2014-08-17 DIAGNOSIS — R0683 Snoring: Secondary | ICD-10-CM

## 2014-09-08 ENCOUNTER — Other Ambulatory Visit (HOSPITAL_COMMUNITY): Payer: Self-pay | Admitting: Respiratory Therapy

## 2014-09-13 ENCOUNTER — Ambulatory Visit: Payer: Medicaid Other | Admitting: *Deleted

## 2014-10-21 ENCOUNTER — Encounter: Payer: Self-pay | Admitting: *Deleted

## 2014-10-21 ENCOUNTER — Encounter: Payer: Medicaid Other | Attending: Certified Nurse Midwife | Admitting: *Deleted

## 2014-10-21 DIAGNOSIS — E669 Obesity, unspecified: Secondary | ICD-10-CM

## 2014-10-21 DIAGNOSIS — Z6841 Body Mass Index (BMI) 40.0 and over, adult: Secondary | ICD-10-CM | POA: Insufficient documentation

## 2014-10-21 DIAGNOSIS — Z713 Dietary counseling and surveillance: Secondary | ICD-10-CM | POA: Diagnosis not present

## 2014-10-21 NOTE — Patient Instructions (Signed)
Aim for 3 meals each day, avoid meal skipping Try to eat meals without distractions (no phone) Still try to eat at ~20 minutes Try to eat less fried meats (more baked, broiled, grilled) Try to eat fruit 5 days/week Try to eat vegetables 5 days/week Try to have milk/yogurt/cheese 3 days Try to drink 2 bottles water/day Try to limit sugary drinks (soda, tea, koolaid) to 2/day

## 2014-10-21 NOTE — Progress Notes (Signed)
  Medical Nutrition Therapy:  Appt start time: 1030 end time:  1130.  Assessment:  Primary concerns today: Lisa Wright is here for nutrition counseling pertaining to referral for obesity.  Lisa Wright states she thinks she doesn't eat enough fruit.  Lab results from July indicate slightly elevated cholesterol.  She states she would also like to lose some weight.  She thinks her metabolism is low because she has low energy levels.  Her energy level has decreased over the summer, she reports.   She does her own grocery shopping and cooking. She typically fries foods, and sometimes bakes her food and boils.  She does not eat out often.  When at home she eats in the living room.  States she does not have a kitchen area to eat in.  She is on her phone while eating. She thinks she is a slow eat: 15-20 minutes.  Sometimes she stops and does something else and comes back to eating.  She tries not to eat after 8 pm.   Does receive SNAP benefits.  Was not receiving adequate food resources for awhile and restricted her intake.  Is now aware of food banks in the area and states she is more food secure. Per patient, provider suggested bariatric surgery.  Patient does not want surgery.    Preferred Learning Style:   No preference indicated   Learning Readiness:   Contemplating- 7:10 readiness   MEDICATIONS: none   DIETARY INTAKE:  Usual eating pattern includes 2 meals and 1-2 snacks per day.  Everyday foods include energy-dense, nutrient-poor processed foods.  Avoided foods include vegetables ("hates them"), doesn't really like fruits either.    24-hr recall:  B ( AM): nothing  Snk ( AM): none  L ( PM): nothing Snk ( PM): none D ( PM): cookout: burger with cheese fries.  cheerwine Snk ( PM): none Beverages: soda, 1 cup water  Usual physical activity: runs up and down the steps 15 minutes a few days/week.  None other  Estimated energy needs: 2200 calories    Nutritional Diagnosis:  NB-1.7 Undesireable food  choices As related to limited consumption nutrient-rich foods.  As evidenced by dietary recall.    Intervention:  Nutrition counseling provided.  Discussed how lifestyle changes can improve her metabolic profile, contribute to weight management, and reduce risk for chronic disease. Educated patient on metabolic effects of meal skipping and discouraged this practice.  Discussed MyPlate recommendations for meal planning, focusing on whole grains, lean proteins, low-fat dairy, and increased fruits/vegetables.  Recommended mindful eating practices and increasing water vs soda.  Discussed with patient goals she could set and changes she felt comfortable making.   Goals: Aim for 3 meals each day, avoid meal skipping Try to eat meals without distractions (no phone) Still try to eat at ~20 minutes Try to eat less fried meats (more baked, broiled, grilled) Try to eat fruit 5 days/week Try to eat vegetables 5 days/week Try to have milk/yogurt/cheese 3 days Try to drink 2 bottles water/day Try to limit sugary drinks (soda, tea, koolaid) to 2/day   Teaching Method Utilized:  Visual Auditory   Handouts given during visit include:  MyPlate  Barriers to learning/adherence to lifestyle change: readiness  Demonstrated degree of understanding via:  Teach Back   Monitoring/Evaluation:  Dietary intake, exercise, and body weight in 2 month(s).

## 2014-12-21 ENCOUNTER — Ambulatory Visit: Payer: Medicaid Other | Admitting: *Deleted

## 2014-12-30 ENCOUNTER — Telehealth: Payer: Self-pay | Admitting: *Deleted

## 2014-12-30 NOTE — Telephone Encounter (Signed)
Patient has the Nexplanon and she is having BTB. Patient is requesting OCP to manage. She did this about 6 months ago.

## 2014-12-31 ENCOUNTER — Other Ambulatory Visit: Payer: Self-pay | Admitting: Obstetrics

## 2014-12-31 DIAGNOSIS — N939 Abnormal uterine and vaginal bleeding, unspecified: Secondary | ICD-10-CM

## 2014-12-31 MED ORDER — NORETHINDRONE-ETH ESTRADIOL 1-35 MG-MCG PO TABS: 1.0000 | ORAL_TABLET | Freq: Every day | ORAL | Status: DC

## 2014-12-31 NOTE — Telephone Encounter (Signed)
[facility] 1/35 Rx, but I gave her refills.  Need to call Pharmacy and cancel refills.

## 2015-01-04 ENCOUNTER — Encounter (HOSPITAL_BASED_OUTPATIENT_CLINIC_OR_DEPARTMENT_OTHER): Payer: Self-pay | Admitting: *Deleted

## 2015-01-04 ENCOUNTER — Emergency Department (HOSPITAL_BASED_OUTPATIENT_CLINIC_OR_DEPARTMENT_OTHER)
Admission: EM | Admit: 2015-01-04 | Discharge: 2015-01-04 | Disposition: A | Discharge status: Home / Self Care | Payer: Medicaid Other | Attending: Emergency Medicine | Admitting: Emergency Medicine

## 2015-01-04 DIAGNOSIS — Z793 Long term (current) use of hormonal contraceptives: Secondary | ICD-10-CM | POA: Diagnosis not present

## 2015-01-04 DIAGNOSIS — Z8619 Personal history of other infectious and parasitic diseases: Secondary | ICD-10-CM | POA: Diagnosis not present

## 2015-01-04 DIAGNOSIS — K029 Dental caries, unspecified: Secondary | ICD-10-CM | POA: Insufficient documentation

## 2015-01-04 DIAGNOSIS — K0889 Other specified disorders of teeth and supporting structures: Secondary | ICD-10-CM | POA: Diagnosis present

## 2015-01-04 DIAGNOSIS — F17210 Nicotine dependence, cigarettes, uncomplicated: Secondary | ICD-10-CM | POA: Insufficient documentation

## 2015-01-04 MED ORDER — PENICILLIN V POTASSIUM 500 MG PO TABS: 500.0000 mg | ORAL_TABLET | Freq: Three times a day (TID) | ORAL | Status: DC

## 2015-01-04 MED ORDER — HYDROCODONE-ACETAMINOPHEN 5-325 MG PO TABS: 1.0000 | ORAL_TABLET | Freq: Four times a day (QID) | ORAL | Status: DC | PRN

## 2015-01-04 NOTE — Telephone Encounter (Signed)
Patient is aware that she does not need top take the refills.

## 2015-01-04 NOTE — ED Provider Notes (Signed)
CSN: [id number]     Arrival date & time 01/04/15  0941 History   First MD Initiated Contact with Patient 01/04/15 1103     Chief Complaint  Patient presents with  . Dental Pain     (Consider location/radiation/quality/duration/timing/severity/associated sxs/prior Treatment) Patient is a 29 y.o. female presenting with tooth pain. The history is provided by the patient.  Dental Pain Location:  Upper Upper teeth location:  14/LU 1st molar Quality:  Throbbing Severity:  Moderate Onset quality:  Gradual Duration:  1 week Timing:  Constant Progression:  Worsening Chronicity:  New Context: dental caries   Relieved by:  Nothing Worsened by:  Hot food/drink, touching and cold food/drink Ineffective treatments:  None tried   Past Medical History  Diagnosis Date  . Vaginal Pap smear, abnormal   . Chlamydia   . Trichomonas infection    Past Surgical History  Procedure Laterality Date  . Cesarean section    . Cesarean section    . Colposcopy     Family History  Problem Relation Age of Onset  . Diabetes Father   . Hypertension Father   . Dementia Maternal Grandmother   . Diabetes Maternal Grandfather   . Diabetes Paternal Grandmother    Social History  Substance Use Topics  . Smoking status: Current Every Day Smoker -- 0.50 packs/day for 14 years    Types: Cigarettes  . Smokeless tobacco: Never Used  . Alcohol Use: No   OB History    Gravida Para Term Preterm AB TAB SAB Ectopic Multiple Living   3 3 3       3      Review of Systems  All other systems reviewed and are negative.     Allergies  Ibuprofen and Percocet  Home Medications   Prior to Admission medications   Medication Sig Start Date End Date Taking? Authorizing Provider  etonogestrel (NEXPLANON) 68 MG IMPL implant 1 each by Subdermal route once.   Yes Historical Provider, MD  norethindrone-ethinyl estradiol 1/35 ([place] 1/35, 28,) tablet Take 1 tablet by mouth daily. 12/31/14  Yes Shelly Bombard, MD   BP 130/76 mmHg  Pulse 69  Temp(Src) 98.1 F (36.7 C) (Oral)  Resp 20  Ht 5\' 6"  (1.676 m)  Wt 240 lb (108.863 kg)  BMI 38.76 kg/m2  SpO2 98% Physical Exam  Constitutional: She is oriented to person, place, and time. She appears well-developed and well-nourished. No distress.  HENT:  Head: Normocephalic and atraumatic.  There are multiple caries throughout. The top bicuspid and first molar on the left have large caries present. There is mild surrounding gingival inflammation, however no evidence for abscess. She is able to open and close her mouth without difficulty. There is no swelling of the neck or submental space.  Neck: Normal range of motion. Neck supple.  Neurological: She is alert and oriented to person, place, and time.  Skin: Skin is warm and dry. She is not diaphoretic.  Nursing note and vitals reviewed.   ED Course  Procedures (including critical care time) Labs Review Labs Reviewed - No data to display  Imaging Review No results found. I have personally reviewed and evaluated these images and lab results as part of my medical decision-making.   EKG Interpretation None      MDM   Final diagnoses:  None    We'll treat as dental caries/dental infection with penicillin, pain medication, and follow-up with dentistry. She tells me that she has a dentist who will get  her in the beginning of January.    Veryl Speak, MD 01/04/15 [phone]

## 2015-01-04 NOTE — ED Notes (Signed)
Patient states she was eating some crispy fried chicken several months ago, and broke the left front tooth.  States the hole has gotten bigger.

## 2015-01-04 NOTE — Discharge Instructions (Signed)
Penicillin as prescribed.  Hydrocodone as prescribed as needed for pain.  Follow-up with your dentist as soon as possible.   Dental Caries Dental caries (also called tooth decay) is the most common oral disease. It can occur at any age but is more common in children and young adults.  HOW DENTAL CARIES DEVELOPS  The process of decay begins when bacteria and foods (particularly sugars and starches) combine in your mouth to produce plaque. Plaque is a substance that sticks to the hard, outer surface of a tooth (enamel). The bacteria in plaque produce acids that attack enamel. These acids may also attack the root surface of a tooth (cementum) if it is exposed. Repeated attacks dissolve these surfaces and create holes in the tooth (cavities). If left untreated, the acids destroy the other layers of the tooth.  RISK FACTORS  Frequent sipping of sugary beverages.   Frequent snacking on sugary and starchy foods, especially those that easily get stuck in the teeth.   Poor oral hygiene.   Dry mouth.   Substance abuse such as methamphetamine abuse.   Broken or poor-fitting dental restorations.   Eating disorders.   Gastroesophageal reflux disease (GERD).   Certain radiation treatments to the head and neck. SYMPTOMS In the early stages of dental caries, symptoms are seldom present. Sometimes white, chalky areas may be seen on the enamel or other tooth layers. In later stages, symptoms may include:  Pits and holes on the enamel.  Toothache after sweet, hot, or cold foods or drinks are consumed.  Pain around the tooth.  Swelling around the tooth. DIAGNOSIS  Most of the time, dental caries is detected during a regular dental checkup. A diagnosis is made after a thorough medical and dental history is taken and the surfaces of your teeth are checked for signs of dental caries. Sometimes special instruments, such as lasers, are used to check for dental caries. Dental X-ray exams may  be taken so that areas not visible to the eye (such as between the contact areas of the teeth) can be checked for cavities.  TREATMENT  If dental caries is in its early stages, it may be reversed with a fluoride treatment or an application of a remineralizing agent at the dental office. Thorough brushing and flossing at home is needed to aid these treatments. If it is in its later stages, treatment depends on the location and extent of tooth destruction:   If a small area of the tooth has been destroyed, the destroyed area will be removed and cavities will be filled with a material such as gold, silver amalgam, or composite resin.   If a large area of the tooth has been destroyed, the destroyed area will be removed and a cap (crown) will be fitted over the remaining tooth structure.   If the center part of the tooth (pulp) is affected, a procedure called a root canal will be needed before a filling or crown can be placed.   If most of the tooth has been destroyed, the tooth may need to be pulled (extracted). HOME CARE INSTRUCTIONS You can prevent, stop, or reverse dental caries at home by practicing good oral hygiene. Good oral hygiene includes:  Thoroughly cleaning your teeth at least twice a day with a toothbrush and dental floss.   Using a fluoride toothpaste. A fluoride mouth rinse may also be used if recommended by your dentist or health care provider.   Restricting the amount of sugary and starchy foods and sugary liquids  you consume.   Avoiding frequent snacking on these foods and sipping of these liquids.   Keeping regular visits with a dentist for checkups and cleanings. PREVENTION   Practice good oral hygiene.  Consider a dental sealant. A dental sealant is a coating material that is applied by your dentist to the pits and grooves of teeth. The sealant prevents food from being trapped in them. It may protect the teeth for several years.  Ask about fluoride supplements if  you live in a community without fluorinated water or with water that has a low fluoride content. Use fluoride supplements as directed by your dentist or health care provider.  Allow fluoride varnish applications to teeth if directed by your dentist or health care provider.   This information is not intended to replace advice given to you by your health care provider. Make sure you discuss any questions you have with your health care provider.   Document Released: 09/30/2001 Document Revised: 01/29/2014 Document Reviewed: 01/11/2012 Elsevier Interactive Patient Education [employer].

## 2015-03-02 ENCOUNTER — Other Ambulatory Visit: Payer: Self-pay | Admitting: Obstetrics

## 2015-03-02 ENCOUNTER — Telehealth: Payer: Self-pay | Admitting: *Deleted

## 2015-03-02 DIAGNOSIS — L0293 Carbuncle, unspecified: Secondary | ICD-10-CM

## 2015-03-02 NOTE — Telephone Encounter (Signed)
Patient is calling to get an updated referral to the skin doctor. She had been referred in the past to [facility] in [place]- but now they have an office in [place]. They said they could see her- but they would need a new referral. She was going to them for her treatment of frequents boils/cysts.

## 2015-03-02 NOTE — Telephone Encounter (Signed)
Referred to [facility].

## 2015-03-10 ENCOUNTER — Telehealth: Payer: Self-pay

## 2015-03-10 NOTE — Telephone Encounter (Signed)
patient has appt with [facility] on 3/13 at 12:00pm - her mailbox is full, left callback number - they also mailed her a notification of appt.

## 2015-04-13 ENCOUNTER — Emergency Department (HOSPITAL_COMMUNITY)
Admission: EM | Admit: 2015-04-13 | Discharge: 2015-04-14 | Disposition: A | Discharge status: Home / Self Care | Payer: Medicaid Other | Attending: Emergency Medicine | Admitting: Emergency Medicine

## 2015-04-13 ENCOUNTER — Encounter (HOSPITAL_COMMUNITY): Payer: Self-pay | Admitting: Emergency Medicine

## 2015-04-13 DIAGNOSIS — N898 Other specified noninflammatory disorders of vagina: Secondary | ICD-10-CM | POA: Diagnosis present

## 2015-04-13 DIAGNOSIS — Z79899 Other long term (current) drug therapy: Secondary | ICD-10-CM | POA: Insufficient documentation

## 2015-04-13 DIAGNOSIS — Z792 Long term (current) use of antibiotics: Secondary | ICD-10-CM | POA: Insufficient documentation

## 2015-04-13 DIAGNOSIS — F17210 Nicotine dependence, cigarettes, uncomplicated: Secondary | ICD-10-CM | POA: Insufficient documentation

## 2015-04-13 DIAGNOSIS — Z3202 Encounter for pregnancy test, result negative: Secondary | ICD-10-CM | POA: Diagnosis not present

## 2015-04-13 DIAGNOSIS — Z8619 Personal history of other infectious and parasitic diseases: Secondary | ICD-10-CM | POA: Diagnosis not present

## 2015-04-13 LAB — URINALYSIS, ROUTINE W REFLEX MICROSCOPIC
Bilirubin Urine: NEGATIVE
GLUCOSE, UA: NEGATIVE mg/dL
Hgb urine dipstick: NEGATIVE
KETONES UR: NEGATIVE mg/dL
LEUKOCYTES UA: NEGATIVE
NITRITE: NEGATIVE
PROTEIN: NEGATIVE mg/dL
Specific Gravity, Urine: 1.025 (ref 1.005–1.030)
pH: 6.5 (ref 5.0–8.0)

## 2015-04-13 LAB — POC URINE PREG, ED
PREG TEST UR: NEGATIVE
PREG TEST UR: NEGATIVE

## 2015-04-13 NOTE — ED Notes (Signed)
Pt states that she has had vaginal itching/irritation x 5 days. White thick discharge. Alert and oriented.

## 2015-04-13 NOTE — ED Provider Notes (Signed)
CSN: [id number]     Arrival date & time 04/13/15  2106 History   First MD Initiated Contact with Patient 04/13/15 2243     Chief Complaint  Patient presents with  . Vaginal Itching     (Consider location/radiation/quality/duration/timing/severity/associated sxs/prior Treatment) Patient is a 30 y.o. female presenting with vaginal itching. The history is provided by the patient and medical records.  Vaginal Itching  30 year old female with no significant past medical history presenting to the ED for vaginal irritation for the past 5 days. She reports she has had some thick, white vaginal discharge as well as vaginal itching. She denies any dysuria or hematuria. She denies any abnormal vaginal bleeding.  She denies any abdominal pain, pelvic pain, fever, or chills. She states she has recently started using a new soap with sugar scrub mixed in it and is not sure this is what is causing the irritation. She does report history of STD in the past, but denies concern for this currently. No new sexual partners.  VSS.  Past Medical History  Diagnosis Date  . Vaginal Pap smear, abnormal   . Chlamydia   . Trichomonas infection    Past Surgical History  Procedure Laterality Date  . Cesarean section    . Cesarean section    . Colposcopy     Family History  Problem Relation Age of Onset  . Diabetes Father   . Hypertension Father   . Dementia Maternal Grandmother   . Diabetes Maternal Grandfather   . Diabetes Paternal Grandmother    Social History  Substance Use Topics  . Smoking status: Current Every Day Smoker -- 0.50 packs/day for 14 years    Types: Cigarettes  . Smokeless tobacco: Never Used  . Alcohol Use: No   OB History    Gravida Para Term Preterm AB TAB SAB Ectopic Multiple Living   3 3 3       3      Review of Systems  Genitourinary: Positive for vaginal discharge and vaginal pain (irritation).  All other systems reviewed and are negative.     Allergies  Ibuprofen and  Percocet  Home Medications   Prior to Admission medications   Medication Sig Start Date End Date Taking? Authorizing Provider  acidophilus (RISAQUAD) CAPS capsule Take 1 capsule by mouth daily.   Yes Historical Provider, MD  etonogestrel (NEXPLANON) 68 MG IMPL implant 1 each by Subdermal route once.   Yes Historical Provider, MD  minocycline (MINOCIN,DYNACIN) 50 MG capsule Take 1 capsule by mouth 2 (two) times daily. 04/04/15  Yes Historical Provider, MD  HYDROcodone-acetaminophen (NORCO) 5-325 MG tablet Take 1-2 tablets by mouth every 6 (six) hours as needed. Patient not taking: Reported on 04/13/2015 01/04/15   Veryl Speak, MD  norethindrone-ethinyl estradiol 1/35 ([place] 1/35, 28,) tablet Take 1 tablet by mouth daily. Patient not taking: Reported on 04/13/2015 12/31/14   Shelly Bombard, MD  penicillin v potassium (VEETID) 500 MG tablet Take 1 tablet (500 mg total) by mouth 3 (three) times daily. Patient not taking: Reported on 04/13/2015 01/04/15   Veryl Speak, MD   BP 134/72 mmHg  Pulse 64  Temp(Src) 98.2 F (36.8 C) (Oral)  Resp 18  SpO2 99%   Physical Exam  Constitutional: She is oriented to person, place, and time. She appears well-developed and well-nourished. No distress.  HENT:  Head: Normocephalic and atraumatic.  Mouth/Throat: Oropharynx is clear and moist.  Eyes: Conjunctivae and EOM are normal. Pupils are equal, round, and reactive to  light.  Neck: Normal range of motion. Neck supple.  Cardiovascular: Normal rate, regular rhythm and normal heart sounds.   Pulmonary/Chest: Effort normal and breath sounds normal. No respiratory distress. She has no wheezes.  Genitourinary: Pelvic exam was performed with patient supine. There is no lesion on the right labia. There is no lesion on the left labia. Cervix exhibits no motion tenderness. Right adnexum displays no tenderness. Left adnexum displays no tenderness. No foreign body around the vagina. No vaginal discharge found.   Normal female external genitalia, areas of irritation surrounding introitus, worse on left side; no lesions or sores; no apparent vaginal discharge noted on exam; no vaginal bleeding or FB noted; no adnexal or CMT  Musculoskeletal: Normal range of motion.  Neurological: She is alert and oriented to person, place, and time.  Skin: Skin is warm and dry. She is not diaphoretic.  Psychiatric: She has a normal mood and affect.  Nursing note and vitals reviewed.   ED Course  Procedures (including critical care time) Labs Review Labs Reviewed  WET PREP, GENITAL  URINALYSIS, ROUTINE W REFLEX MICROSCOPIC (NOT AT [facility])  POC URINE PREG, ED  POC URINE PREG, ED  GC/CHLAMYDIA PROBE AMP ([place]) NOT AT [facility]    Imaging Review No results found. I have personally reviewed and evaluated these images and lab results as part of my medical decision-making.   EKG Interpretation None      MDM   Final diagnoses:  Vaginal irritation   30 year old female here with vaginal irritation/itching for the past 5 days. She reports vaginal discharge. Patient afebrile, nontoxic. Abdominal exam is benign. Pelvic exam with evidence of irritation surrounding vaginal introitus, worse on her left side. She has no lesions or sores. She has no discharge noted on exam, no adnexal or cervical motion tenderness. u-preg negative.  U/a non-infectious.  Wet prep negative.  Gc/chl pending.  Patient has no expressed concern for STD at this time.  It appears that her vaginal irritation and itching is external, possibly due to her new soap with sugar scrub. Recommended gentle soap/cleaning for the next few days until improves.  Discussed plan with patient, he/she acknowledged understanding and agreed with plan of care.  Return precautions given for new or worsening symptoms.  [name], PA-C 04/14/15 [place], DO 04/14/15 [phone]

## 2015-04-14 LAB — WET PREP, GENITAL
CLUE CELLS WET PREP: NONE SEEN
Sperm: NONE SEEN
Trich, Wet Prep: NONE SEEN
WBC WET PREP: NONE SEEN
YEAST WET PREP: NONE SEEN

## 2015-04-14 NOTE — Discharge Instructions (Signed)
Recommend to use gentle soap to prevent further irritation. Follow-up with your primary care physician. Return here for new concerns.

## 2015-04-15 LAB — GC/CHLAMYDIA PROBE AMP (~~LOC~~) NOT AT ARMC
CHLAMYDIA, DNA PROBE: NEGATIVE
Neisseria Gonorrhea: NEGATIVE

## 2015-08-05 ENCOUNTER — Ambulatory Visit (HOSPITAL_COMMUNITY)
Admission: EM | Admit: 2015-08-05 | Discharge: 2015-08-05 | Disposition: A | Discharge status: Home / Self Care | Payer: Medicaid Other | Attending: Internal Medicine | Admitting: Internal Medicine

## 2015-08-05 ENCOUNTER — Encounter (HOSPITAL_COMMUNITY): Payer: Self-pay | Admitting: Emergency Medicine

## 2015-08-05 DIAGNOSIS — K0889 Other specified disorders of teeth and supporting structures: Secondary | ICD-10-CM

## 2015-08-05 MED ORDER — HYDROCODONE-ACETAMINOPHEN 5-325 MG PO TABS: 1.0000 | ORAL_TABLET | Freq: Four times a day (QID) | ORAL | Status: DC | PRN

## 2015-08-05 MED ORDER — PENICILLIN V POTASSIUM 500 MG PO TABS: 500.0000 mg | ORAL_TABLET | Freq: Three times a day (TID) | ORAL | Status: DC

## 2015-08-05 NOTE — Discharge Instructions (Signed)

## 2015-08-05 NOTE — ED Provider Notes (Signed)
CSN: [id number]     Arrival date & time 08/05/15  1048 History   First MD Initiated Contact with Patient 08/05/15 1122     Chief Complaint  Patient presents with  . Dental Pain   (Consider location/radiation/quality/duration/timing/severity/associated sxs/prior Treatment) Patient is a 30 y.o. female presenting with tooth pain. The history is provided by the patient. No language interpreter was used.  Dental Pain Location:  Upper Upper teeth location:  13/LU 2nd bicuspid and 12/LU 1st bicuspid Quality:  Aching Severity:  Moderate Onset quality:  Gradual Timing:  Constant Progression:  Worsening Chronicity:  New Context: dental caries   Relieved by:  Nothing Worsened by:  Nothing tried Ineffective treatments:  None tried Associated symptoms: facial pain and facial swelling   Associated symptoms: no congestion   Risk factors: periodontal disease   Pt has a dentist appointment on Monday  Past Medical History  Diagnosis Date  . Vaginal Pap smear, abnormal   . Chlamydia   . Trichomonas infection    Past Surgical History  Procedure Laterality Date  . Cesarean section    . Cesarean section    . Colposcopy     Family History  Problem Relation Age of Onset  . Diabetes Father   . Hypertension Father   . Dementia Maternal Grandmother   . Diabetes Maternal Grandfather   . Diabetes Paternal Grandmother    Social History  Substance Use Topics  . Smoking status: Current Every Day Smoker -- 0.30 packs/day for 14 years    Types: Cigarettes  . Smokeless tobacco: Never Used  . Alcohol Use: No   OB History    Gravida Para Term Preterm AB TAB SAB Ectopic Multiple Living   3 3 3       3      Review of Systems  HENT: Positive for facial swelling. Negative for congestion.   All other systems reviewed and are negative.   Allergies  Ibuprofen and Percocet  Home Medications   Prior to Admission medications   Medication Sig Start Date End Date Taking? Authorizing Provider   acidophilus (RISAQUAD) CAPS capsule Take 1 capsule by mouth daily.    Historical Provider, MD  etonogestrel (NEXPLANON) 68 MG IMPL implant 1 each by Subdermal route once.    Historical Provider, MD  HYDROcodone-acetaminophen (NORCO) 5-325 MG tablet Take 1-2 tablets by mouth every 6 (six) hours as needed. 08/05/15   Fransico Meadow, PA-C  minocycline (MINOCIN,DYNACIN) 50 MG capsule Take 1 capsule by mouth 2 (two) times daily. 04/04/15   Historical Provider, MD  norethindrone-ethinyl estradiol 1/35 ([place] 1/35, 28,) tablet Take 1 tablet by mouth daily. Patient not taking: Reported on 04/13/2015 12/31/14   Shelly Bombard, MD  penicillin v potassium (VEETID) 500 MG tablet Take 1 tablet (500 mg total) by mouth 3 (three) times daily. 08/05/15   Fransico Meadow, PA-C   Meds Ordered and Administered this Visit  Medications - No data to display  BP 113/71 mmHg  Pulse 56  Temp(Src) 98.2 F (36.8 C) (Oral)  Resp 16  Ht 5\' 5"  (1.651 m)  SpO2 99%  LMP 07/09/2015 No data found.   Physical Exam  Constitutional: She appears well-developed and well-nourished.  HENT:  Head: Normocephalic.  Right Ear: External ear normal.  Mouth/Throat: Oropharynx is clear and moist.  Dental decay  Eyes: Conjunctivae are normal. Pupils are equal, round, and reactive to light.  Neck: Normal range of motion. Neck supple.  Cardiovascular: Normal rate.   Pulmonary/Chest: Effort normal.  Musculoskeletal: Normal range of motion.  Neurological: She is alert.  Skin: Skin is warm.  Psychiatric: She has a normal mood and affect.    ED Course  Procedures (including critical care time)  Labs Review Labs Reviewed - No data to display  Imaging Review No results found.   Visual Acuity Review  Right Eye Distance:   Left Eye Distance:   Bilateral Distance:    Right Eye Near:   Left Eye Near:    Bilateral Near:         MDM   1. Toothache    An After Visit Summary was printed and given to the  patient. Meds ordered this encounter  Medications  . penicillin v potassium (VEETID) 500 MG tablet    Sig: Take 1 tablet (500 mg total) by mouth 3 (three) times daily.    Dispense:  30 tablet    Refill:  0    Order Specific Question:  Supervising Provider    Answer:  Sherlene Shams [id number]  . HYDROcodone-acetaminophen (NORCO) 5-325 MG tablet    Sig: Take 1-2 tablets by mouth every 6 (six) hours as needed.    Dispense:  15 tablet    Refill:  0    Order Specific Question:  Supervising Provider    Answer:  Sherlene Shams [id number]     Fransico Meadow, PA-C 08/05/15 1328

## 2015-08-05 NOTE — ED Notes (Signed)
PT has a toothache that has bothered her before. PT reports her left face feels swollen. Pain has been severe since yesterday. PT has a dental appt. Monday. PT was told to come get antibiotics and pain medicine.

## 2015-08-16 ENCOUNTER — Encounter: Payer: Self-pay | Admitting: Certified Nurse Midwife

## 2015-08-16 ENCOUNTER — Ambulatory Visit (INDEPENDENT_AMBULATORY_CARE_PROVIDER_SITE_OTHER): Payer: Medicaid Other | Admitting: Certified Nurse Midwife

## 2015-08-16 VITALS — BP 123/82 | HR 72 | Temp 99.1°F | Wt 232.2 lb

## 2015-08-16 DIAGNOSIS — Z0000 Encounter for general adult medical examination without abnormal findings: Secondary | ICD-10-CM | POA: Diagnosis not present

## 2015-08-16 DIAGNOSIS — Z01419 Encounter for gynecological examination (general) (routine) without abnormal findings: Secondary | ICD-10-CM

## 2015-08-16 DIAGNOSIS — Z113 Encounter for screening for infections with a predominantly sexual mode of transmission: Secondary | ICD-10-CM

## 2015-08-16 DIAGNOSIS — Z3042 Encounter for surveillance of injectable contraceptive: Secondary | ICD-10-CM | POA: Diagnosis not present

## 2015-08-16 DIAGNOSIS — Z3169 Encounter for other general counseling and advice on procreation: Secondary | ICD-10-CM

## 2015-08-16 MED ORDER — PRIMACARE 30-1-470 MG PO CAPS: 1.0000 | ORAL_CAPSULE | Freq: Every day | ORAL | 99 refills | Status: DC

## 2015-08-16 NOTE — Progress Notes (Signed)
Procedure Note Removal of Nexplanon  Patient had Nexplanon inserted in 2014. Desires removal today.   Reviewed risk and benefits of procedure. Alternative options discussed Patient reported understanding and agreed to continue.   The patient's left arm was palpated and the implant device located. The area was prepped with Betadinex3. The distal end of the device was palpated and 1 cc of 1% lidocaine without epinephrine was injected. A 2 mm incision was made. Any fibrotic tissue was carefully dissected away using blunt and/or sharp dissection. Over the tip and the tip was exposed, grasped with forcep and removed intact. Steri-strips and a sterile dressing were applied to the incision.   And a bandage applied and the arm was wrapped with gauze bandage.  The patient tolerated well.  Instructions:  The patient was instructed to remove the dressing in 24 hours and that some bruising is to be expected.  She was advised to use over the counter analgesics as needed for any pain at the site.  She is to keep the area dry for 24 hours and to call if her hand or arm becomes cold, numb, or blue.  Return visit:  Return  PRN Patient plans natural family planning (NFP)  Kandis Cocking CNM

## 2015-08-16 NOTE — Progress Notes (Signed)
Subjective:        Lisa Wright is a 30 y.o. female here for a routine exam.  Current complaints:   Patient would like to have Nexplanon removed.  Desire pregnancy in the future.  Patient's partner desires pregnancy for patient.  Reports being in safe supportive relationship.  Reports being in a monogomous relationship.  Reports having full 7 day periods during the past 2 months.  Patient reports "taking this as a sign that the Nexplanon should come out and try for pregnancy." Patient reports going to nutritionist and happy with body image.  Patient reports loosing 1 lb recently & happy with this and will be ok is she does not loose any additional weight.  Reports HS doing better with weight loss & is on antibiotics now.  Reports that if the anitbiotics do not work, she would need Humira (does not desire to be on Humira).  Denies any vaginal issues.  Requesting STD screening.  Personal health questionnaire:  Is patient Ashkenazi Jewish, have a family history of breast and/or ovarian cancer: no Is there a family history of uterine cancer diagnosed at age < 85, gastrointestinal cancer, urinary tract cancer, family member who is a Field seismologist syndrome-associated carrier: no Is the patient overweight and hypertensive, family history of diabetes, personal history of gestational diabetes, preeclampsia or PCOS: yes Is patient over 32, have PCOS,  family history of premature CHD under age 30, diabetes, smoke, have hypertension or peripheral artery disease:  Yes, DM & HTN with her dad.  PGM: DM, PGF: DM At any time, has a partner hit, kicked or otherwise hurt or frightened you?: no Over the past 2 weeks, have you felt down, depressed or hopeless?: no Over the past 2 weeks, have you felt little interest or pleasure in doing things?:has a lot of stressors at the moment   Gynecologic History Patient's last menstrual period was 07/02/2015.  July 10-17 LMP Contraception: Nexplanon, placed 12-01-2012 Last Pap:  07-29-2014. Results were: normal, SES, BS CT(ASCP) Last mammogram: N/A. Results were: N/A  Obstetric History OB History  Gravida Para Term Preterm AB Living  3 3 3     3   SAB TAB Ectopic Multiple Live Births          3    # Outcome Date GA Lbr Len/2nd Weight Sex Delivery Anes PTL Lv  3 Term 06/01/09 [redacted]w[redacted]d  5 lb 4 oz (2.381 kg) M CS-LTranv EPI  LIV  2 Term 03/11/07 [redacted]w[redacted]d  5 lb 6 oz (2.438 kg) M CS-LTranv EPI  LIV  1 Term 04/21/04 [redacted]w[redacted]d  6 lb 10 oz (3.005 kg) M Vag-Spont EPI  LIV      Past Medical History:  Diagnosis Date  . Chlamydia   . Trichomonas infection   . Vaginal Pap smear, abnormal     Past Surgical History:  Procedure Laterality Date  . CESAREAN SECTION    . CESAREAN SECTION    . COLPOSCOPY       Current Outpatient Prescriptions:  .  acidophilus (RISAQUAD) CAPS capsule, Take 1 capsule by mouth daily., Disp: , Rfl:  .  etonogestrel (NEXPLANON) 68 MG IMPL implant, 1 each by Subdermal route once., Disp: , Rfl:  .  HYDROcodone-acetaminophen (NORCO) 5-325 MG tablet, Take 1-2 tablets by mouth every 6 (six) hours as needed., Disp: 15 tablet, Rfl: 0 .  minocycline (MINOCIN,DYNACIN) 50 MG capsule, Take 1 capsule by mouth 2 (two) times daily., Disp: , Rfl: 3 .  penicillin v potassium (VEETID) 500  MG tablet, Take 1 tablet (500 mg total) by mouth 3 (three) times daily., Disp: 30 tablet, Rfl: 0 .  norethindrone-ethinyl estradiol 1/35 ([place] 1/35, 28,) tablet, Take 1 tablet by mouth daily. (Patient not taking: Reported on 04/13/2015), Disp: 1 Package, Rfl: 11 .  Pren-Fe-Meth-FA-Omeg w/o A (PRIMACARE) 30-1-470 MG CAPS, Take 1 tablet by mouth daily., Disp: 30 capsule, Rfl: PRN Allergies  Allergen Reactions  . Ibuprofen Nausea And Vomiting  . Percocet [Oxycodone-Acetaminophen] Itching    Social History  Substance Use Topics  . Smoking status: Current Every Day Smoker    Packs/day: 0.30    Years: 14.00    Types: Cigarettes  . Smokeless tobacco: Never Used     Comment: 1  pack every 3 days  . Alcohol use No    Family History  Problem Relation Age of Onset  . Diabetes Father   . Hypertension Father   . Dementia Maternal Grandmother   . Diabetes Maternal Grandfather   . Diabetes Paternal Grandmother       Review of Systems  Constitutional: negative for fatigue and weight loss Respiratory: negative for cough and wheezing Cardiovascular: negative for chest pain, fatigue and palpitations Gastrointestinal: negative for abdominal pain and change in bowel habits Musculoskeletal:negative for myalgias Neurological: negative for gait problems and tremors Behavioral/Psych: negative for abusive relationship, depression Endocrine: negative for temperature intolerance   Genitourinary:negative for abnormal menstrual periods, genital lesions, hot flashes, sexual problems and vaginal discharge Integument/breast: negative for breast lump, breast tenderness, nipple discharge and skin lesion(s)    Objective:       BP 123/82   Pulse 72   Temp 99.1 F (37.3 C)   Wt 232 lb 3.2 oz (105.3 kg)   LMP 07/02/2015   BMI 38.64 kg/m  General:   alert  Skin:   Signs of healing/healed Hidradenitis Suppurativa  Lungs:   clear to auscultation bilaterally  Heart:   regular rate and rhythm, S1, S2 normal, no murmur, click, rub or gallop  Breasts:   normal without suspicious masses, skin or nipple changes or axillary nodes  Abdomen:  normal findings: no organomegaly, soft, non-tender and no hernia  Pelvis:  External genitalia: normal general appearance Urinary system: urethral meatus normal and bladder without fullness, nontender Vaginal: normal without tenderness, induration or masses Cervix: normal appearance Adnexa: normal bimanual exam Uterus: anteverted and non-tender, normal size   Lab Review Urine pregnancy test Labs reviewed yes Radiologic studies reviewed yes  50% of 30 min visit spent on counseling and coordination of care.   Assessment:    Healthy  female exam.  Hidradenitis Suppurativa Obesity STD screening  Pre-conception counseling  Plan:    Education reviewed: depression evaluation, low fat, low cholesterol diet, safe sex/STD prevention, self breast exams and weight bearing exercise. Contraception: none. Follow up in: 1 year.  Prenatal vitamins. Meds ordered this encounter  Medications  . Pren-Fe-Meth-FA-Omeg w/o A (PRIMACARE) 30-1-470 MG CAPS    Sig: Take 1 tablet by mouth daily.    Dispense:  30 capsule    Refill:  PRN   Orders Placed This Encounter  Procedures  . Hepatitis B surface antigen  . RPR  . Hepatitis C antibody  . HIV antibody   Need to obtain previous records Possible management options include: contraception if pregnancy not desired, continue seeing nutritionist, exercise plan, prenatal vitamins. Follow up as needed.

## 2015-08-17 LAB — RPR: RPR Ser Ql: NONREACTIVE

## 2015-08-17 LAB — HIV ANTIBODY (ROUTINE TESTING W REFLEX): HIV SCREEN 4TH GENERATION: NONREACTIVE

## 2015-08-17 LAB — HEPATITIS C ANTIBODY: HEP C VIRUS AB: 0.3 {s_co_ratio} (ref 0.0–0.9)

## 2015-08-17 LAB — HEPATITIS B SURFACE ANTIGEN: HEP B S AG: NEGATIVE

## 2015-08-18 LAB — PAP IG W/ RFLX HPV ASCU: PAP Smear Comment: 0

## 2015-08-20 LAB — NUSWAB VG+, CANDIDA 6SP
Atopobium vaginae: HIGH Score — AB
CANDIDA GLABRATA, NAA: NEGATIVE
CANDIDA KRUSEI, NAA: NEGATIVE
CANDIDA LUSITANIAE, NAA: NEGATIVE
CANDIDA TROPICALIS, NAA: NEGATIVE
Candida albicans, NAA: NEGATIVE
Candida parapsilosis, NAA: NEGATIVE
Chlamydia trachomatis, NAA: NEGATIVE
MEGASPHAERA 1: HIGH {score} — AB
Neisseria gonorrhoeae, NAA: NEGATIVE
Trich vag by NAA: NEGATIVE

## 2015-08-23 ENCOUNTER — Other Ambulatory Visit: Payer: Self-pay | Admitting: Certified Nurse Midwife

## 2015-08-23 DIAGNOSIS — N760 Acute vaginitis: Principal | ICD-10-CM

## 2015-08-23 DIAGNOSIS — B9689 Other specified bacterial agents as the cause of diseases classified elsewhere: Secondary | ICD-10-CM

## 2015-08-23 MED ORDER — METRONIDAZOLE 500 MG PO TABS
500.0000 mg | ORAL_TABLET | Freq: Two times a day (BID) | ORAL | 0 refills | Status: DC
Start: 2015-08-23 — End: 2015-10-27

## 2015-08-29 ENCOUNTER — Telehealth: Payer: Self-pay | Admitting: *Deleted

## 2015-08-29 DIAGNOSIS — B9689 Other specified bacterial agents as the cause of diseases classified elsewhere: Secondary | ICD-10-CM

## 2015-08-29 DIAGNOSIS — N760 Acute vaginitis: Principal | ICD-10-CM

## 2015-08-29 MED ORDER — METRONIDAZOLE 500 MG PO TABS: 500.0000 mg | ORAL_TABLET | Freq: Two times a day (BID) | ORAL | 0 refills | Status: DC

## 2015-08-29 NOTE — Telephone Encounter (Signed)
Telephone call to patient regarding BV results.  Rx routed to pharmacy.  Instructed patient to avoid alcohol while taking this medication.

## 2015-08-29 NOTE — Telephone Encounter (Signed)
-----   Message from Morene Crocker, CNM sent at 08/23/2015  5:12 PM EDT ----- Please call patient and notify of +BV results. Metrondiazole 500 mg PO BID x7 days. #14 no refills.  Please tell her not to drink alcohol with this medication as it will cause her to vomit.  Please also ask her if she will need anything for yeast.   Thank you, Kandis Cocking CNM

## 2015-09-09 ENCOUNTER — Encounter (HOSPITAL_COMMUNITY): Payer: Self-pay | Admitting: Emergency Medicine

## 2015-09-09 ENCOUNTER — Ambulatory Visit (HOSPITAL_COMMUNITY)
Admission: EM | Admit: 2015-09-09 | Discharge: 2015-09-09 | Disposition: A | Discharge status: Home / Self Care | Payer: Medicaid Other | Attending: Physician Assistant | Admitting: Physician Assistant

## 2015-09-09 DIAGNOSIS — S99921A Unspecified injury of right foot, initial encounter: Secondary | ICD-10-CM

## 2015-09-09 MED ORDER — CEPHALEXIN 250 MG PO CAPS
250.0000 mg | ORAL_CAPSULE | Freq: Four times a day (QID) | ORAL | 0 refills | Status: DC
Start: 2015-09-09 — End: 2015-09-09

## 2015-09-09 MED ORDER — CEPHALEXIN 500 MG PO CAPS: 500.0000 mg | ORAL_CAPSULE | Freq: Three times a day (TID) | ORAL | 0 refills | Status: DC

## 2015-09-09 NOTE — Discharge Instructions (Signed)
SOAK YOUR FOOT IN WARM SOAPY OR EPSOM SALT WATER 3-4 TIMES A DAY  IF THERE IS A PUS SACK YOU MAY NEED TO GET THAT DRAINED  TAKE YOUR MEDICATION AS DIRECTED.

## 2015-09-09 NOTE — ED Triage Notes (Signed)
Pt hit her toe on her couch on Wednesday.  Since then she has had pain and swelling.  She cut her toenail yesterday and reported purulent discharge from the toe.

## 2015-10-27 ENCOUNTER — Encounter (HOSPITAL_COMMUNITY): Payer: Self-pay | Admitting: Emergency Medicine

## 2015-10-27 ENCOUNTER — Ambulatory Visit (HOSPITAL_COMMUNITY)
Admission: EM | Admit: 2015-10-27 | Discharge: 2015-10-27 | Disposition: A | Discharge status: Home / Self Care | Payer: Medicaid Other | Attending: Internal Medicine | Admitting: Internal Medicine

## 2015-10-27 DIAGNOSIS — N72 Inflammatory disease of cervix uteri: Secondary | ICD-10-CM | POA: Diagnosis not present

## 2015-10-27 DIAGNOSIS — N941 Unspecified dyspareunia: Secondary | ICD-10-CM

## 2015-10-27 DIAGNOSIS — N898 Other specified noninflammatory disorders of vagina: Secondary | ICD-10-CM

## 2015-10-27 MED ORDER — CEFTRIAXONE SODIUM 250 MG IJ SOLR
INTRAMUSCULAR | Status: AC
  Filled 2015-10-27: qty 250

## 2015-10-27 MED ORDER — CEFTRIAXONE SODIUM 250 MG IJ SOLR
250.0000 mg | Freq: Once | INTRAMUSCULAR | Status: AC
  Administered 2015-10-27: 250 mg via INTRAMUSCULAR

## 2015-10-27 MED ORDER — LIDOCAINE HCL (PF) 1 % IJ SOLN
INTRAMUSCULAR | Status: AC
  Filled 2015-10-27: qty 2

## 2015-10-27 MED ORDER — AZITHROMYCIN 250 MG PO TABS
ORAL_TABLET | ORAL | Status: AC
  Filled 2015-10-27: qty 4

## 2015-10-27 MED ORDER — FLUCONAZOLE 150 MG PO TABS: ORAL_TABLET | ORAL | 0 refills | Status: DC

## 2015-10-27 MED ORDER — METRONIDAZOLE 500 MG PO TABS: 500.0000 mg | ORAL_TABLET | Freq: Two times a day (BID) | ORAL | 0 refills | Status: DC

## 2015-10-27 MED ORDER — AZITHROMYCIN 250 MG PO TABS
1000.0000 mg | ORAL_TABLET | Freq: Once | ORAL | Status: AC
  Administered 2015-10-27: 1000 mg via ORAL

## 2015-10-27 NOTE — ED Provider Notes (Signed)
CSN: [id number]     Arrival date & time 10/27/15  1609 History   First MD Initiated Contact with Patient 10/27/15 1741     Chief Complaint  Patient presents with  . Vaginitis   (Consider location/radiation/quality/duration/timing/severity/associated sxs/prior Treatment) 30 year old female complaining of thick white vaginal discharge for approximately one week and associated with this. Anemia. Denies urinary symptoms. LMP was 10/07/2015.      Past Medical History:  Diagnosis Date  . Chlamydia   . Trichomonas infection   . Vaginal Pap smear, abnormal    Past Surgical History:  Procedure Laterality Date  . CESAREAN SECTION    . CESAREAN SECTION    . COLPOSCOPY     Family History  Problem Relation Age of Onset  . Diabetes Father   . Hypertension Father   . Dementia Maternal Grandmother   . Diabetes Maternal Grandfather   . Diabetes Paternal Grandmother    Social History  Substance Use Topics  . Smoking status: Current Every Day Smoker    Packs/day: 0.30    Years: 14.00    Types: Cigarettes  . Smokeless tobacco: Never Used     Comment: 1 pack every 3 days  . Alcohol use No   OB History    Gravida Para Term Preterm AB Living   3 3 3     3    SAB TAB Ectopic Multiple Live Births           3     Review of Systems  Constitutional: Negative.   HENT: Negative.   Respiratory: Negative.   Gastrointestinal: Negative.   Genitourinary: Positive for dyspareunia and vaginal discharge. Negative for dysuria, frequency, hematuria, menstrual problem and pelvic pain.  Musculoskeletal: Negative.   All other systems reviewed and are negative.   Allergies  Ibuprofen and Percocet [oxycodone-acetaminophen]  Home Medications   Prior to Admission medications   Medication Sig Start Date End Date Taking? Authorizing Provider  acidophilus (RISAQUAD) CAPS capsule Take 1 capsule by mouth daily.    Historical Provider, MD  fluconazole (DIFLUCAN) 150 MG tablet 1 tab po x 1. May repeat  in 72 hours if no improvement 10/27/15   Janne Napoleon, NP  metroNIDAZOLE (FLAGYL) 500 MG tablet Take 1 tablet (500 mg total) by mouth 2 (two) times daily. X 7 days 10/27/15   Janne Napoleon, NP  Pren-Fe-Meth-FA-Omeg w/o A (PRIMACARE) 30-1-470 MG CAPS Take 1 tablet by mouth daily. 08/16/15   Morene Crocker, CNM   Meds Ordered and Administered this Visit   Medications  cefTRIAXone (ROCEPHIN) injection 250 mg (not administered)  azithromycin (ZITHROMAX) tablet 1,000 mg (not administered)    BP 102/56 (BP Location: Left Arm)   Pulse 60   Temp 98.7 F (37.1 C) (Oral)   Resp 12   LMP 10/07/2015 (Exact Date)   SpO2 99%  No data found.   Physical Exam  Constitutional: She is oriented to person, place, and time. She appears well-developed and well-nourished. No distress.  Eyes: EOM are normal.  Neck: Normal range of motion. Neck supple.  Cardiovascular: Normal rate.   Pulmonary/Chest: Effort normal. No respiratory distress.  Genitourinary:  Genitourinary Comments: Thin white to gray vaginal discharge associated with some bubbling slightly green discharge at the cervix. Only a portion of the cervix is visible due to body habitus, os is not well visualized nor the ectocervix. Tenderness to the cervix and right adnexa.  Musculoskeletal: She exhibits no edema.  Neurological: She is alert and oriented to person, place, and  time. She exhibits normal muscle tone.  Skin: Skin is warm and dry.  Psychiatric: She has a normal mood and affect.  Nursing note and vitals reviewed.   Urgent Care Course   Clinical Course    Procedures (including critical care time)  Labs Review Labs Reviewed  CERVICOVAGINAL ANCILLARY ONLY    Imaging Review No results found.   Visual Acuity Review  Right Eye Distance:   Left Eye Distance:   Bilateral Distance:    Right Eye Near:   Left Eye Near:    Bilateral Near:         MDM   1. Vaginal discharge   2. Cervicitis   3. Dyspareunia in female      Meds ordered this encounter  Medications  . cefTRIAXone (ROCEPHIN) injection 250 mg  . azithromycin (ZITHROMAX) tablet 1,000 mg  . metroNIDAZOLE (FLAGYL) 500 MG tablet    Sig: Take 1 tablet (500 mg total) by mouth 2 (two) times daily. X 7 days    Dispense:  14 tablet    Refill:  0    Order Specific Question:   Supervising Provider    Answer:   Sherlene Shams [id number]  . fluconazole (DIFLUCAN) 150 MG tablet    Sig: 1 tab po x 1. May repeat in 72 hours if no improvement    Dispense:  2 tablet    Refill:  0    Order Specific Question:   Supervising Provider    Answer:   Sherlene Shams [id number]   Cervical ancillary pending   Janne Napoleon, NP 10/27/15 1811

## 2015-10-27 NOTE — ED Triage Notes (Signed)
Pt has been experiencing pain with intercourse and reports a white, thick discharge.  She denies any other symptoms.

## 2015-10-28 LAB — CERVICOVAGINAL ANCILLARY ONLY
CHLAMYDIA, DNA PROBE: NEGATIVE
NEISSERIA GONORRHEA: NEGATIVE
Wet Prep (BD Affirm): POSITIVE — AB

## 2015-12-12 ENCOUNTER — Ambulatory Visit (INDEPENDENT_AMBULATORY_CARE_PROVIDER_SITE_OTHER): Payer: Medicaid Other | Admitting: *Deleted

## 2015-12-12 VITALS — BP 107/69 | HR 55 | Wt 250.0 lb

## 2015-12-12 DIAGNOSIS — Z23 Encounter for immunization: Secondary | ICD-10-CM | POA: Diagnosis not present

## 2016-05-14 ENCOUNTER — Ambulatory Visit: Payer: Medicaid Other | Admitting: Obstetrics

## 2016-05-24 ENCOUNTER — Ambulatory Visit: Payer: Medicaid Other | Admitting: Obstetrics

## 2016-07-28 ENCOUNTER — Encounter (HOSPITAL_BASED_OUTPATIENT_CLINIC_OR_DEPARTMENT_OTHER): Payer: Self-pay | Admitting: Emergency Medicine

## 2016-07-28 ENCOUNTER — Emergency Department (HOSPITAL_BASED_OUTPATIENT_CLINIC_OR_DEPARTMENT_OTHER)
Admission: EM | Admit: 2016-07-28 | Discharge: 2016-07-28 | Disposition: A | Discharge status: Home / Self Care | Payer: Medicaid Other | Attending: Emergency Medicine | Admitting: Emergency Medicine

## 2016-07-28 DIAGNOSIS — F17210 Nicotine dependence, cigarettes, uncomplicated: Secondary | ICD-10-CM | POA: Diagnosis not present

## 2016-07-28 DIAGNOSIS — Y92410 Unspecified street and highway as the place of occurrence of the external cause: Secondary | ICD-10-CM | POA: Diagnosis not present

## 2016-07-28 DIAGNOSIS — M7918 Myalgia, other site: Secondary | ICD-10-CM

## 2016-07-28 DIAGNOSIS — M791 Myalgia: Secondary | ICD-10-CM | POA: Diagnosis not present

## 2016-07-28 DIAGNOSIS — Y999 Unspecified external cause status: Secondary | ICD-10-CM | POA: Insufficient documentation

## 2016-07-28 DIAGNOSIS — M545 Low back pain: Secondary | ICD-10-CM | POA: Insufficient documentation

## 2016-07-28 DIAGNOSIS — Y9389 Activity, other specified: Secondary | ICD-10-CM | POA: Insufficient documentation

## 2016-07-28 DIAGNOSIS — Z79899 Other long term (current) drug therapy: Secondary | ICD-10-CM | POA: Insufficient documentation

## 2016-07-28 HISTORY — DX: Migraine, unspecified, not intractable, without status migrainosus: G43.909

## 2016-07-28 MED ORDER — NAPROXEN 500 MG PO TABS: 500.0000 mg | ORAL_TABLET | Freq: Two times a day (BID) | ORAL | 0 refills | Status: DC

## 2016-07-28 MED ORDER — METHOCARBAMOL 500 MG PO TABS: 1000.0000 mg | ORAL_TABLET | Freq: Four times a day (QID) | ORAL | 0 refills | Status: DC
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
## 2017-11-27 ENCOUNTER — Emergency Department (HOSPITAL_BASED_OUTPATIENT_CLINIC_OR_DEPARTMENT_OTHER)
Admission: EM | Admit: 2017-11-27 | Discharge: 2017-11-27 | Disposition: A | Discharge status: Home / Self Care | Payer: Medicaid Other | Attending: Emergency Medicine | Admitting: Emergency Medicine

## 2017-11-27 ENCOUNTER — Encounter (HOSPITAL_BASED_OUTPATIENT_CLINIC_OR_DEPARTMENT_OTHER): Payer: Self-pay

## 2017-11-27 ENCOUNTER — Other Ambulatory Visit: Payer: Self-pay

## 2017-11-27 ENCOUNTER — Emergency Department (HOSPITAL_BASED_OUTPATIENT_CLINIC_OR_DEPARTMENT_OTHER): Payer: Medicaid Other

## 2017-11-27 DIAGNOSIS — O23591 Infection of other part of genital tract in pregnancy, first trimester: Secondary | ICD-10-CM | POA: Insufficient documentation

## 2017-11-27 DIAGNOSIS — B9689 Other specified bacterial agents as the cause of diseases classified elsewhere: Secondary | ICD-10-CM | POA: Diagnosis not present

## 2017-11-27 DIAGNOSIS — N760 Acute vaginitis: Secondary | ICD-10-CM

## 2017-11-27 DIAGNOSIS — Z3A01 Less than 8 weeks gestation of pregnancy: Secondary | ICD-10-CM | POA: Insufficient documentation

## 2017-11-27 DIAGNOSIS — F17210 Nicotine dependence, cigarettes, uncomplicated: Secondary | ICD-10-CM | POA: Diagnosis not present

## 2017-11-27 LAB — WET PREP, GENITAL
Sperm: NONE SEEN
TRICH WET PREP: NONE SEEN
YEAST WET PREP: NONE SEEN

## 2017-11-27 LAB — URINALYSIS, MICROSCOPIC (REFLEX)

## 2017-11-27 LAB — URINALYSIS, ROUTINE W REFLEX MICROSCOPIC
Bilirubin Urine: NEGATIVE
GLUCOSE, UA: NEGATIVE mg/dL
Hgb urine dipstick: NEGATIVE
Ketones, ur: NEGATIVE mg/dL
NITRITE: NEGATIVE
PH: 7 (ref 5.0–8.0)
Protein, ur: NEGATIVE mg/dL
SPECIFIC GRAVITY, URINE: 1.02 (ref 1.005–1.030)

## 2017-11-27 LAB — PREGNANCY, URINE: Preg Test, Ur: POSITIVE — AB

## 2017-11-27 LAB — HCG, QUANTITATIVE, PREGNANCY: HCG, BETA CHAIN, QUANT, S: 26131 m[IU]/mL — AB (ref <5)

## 2017-11-27 NOTE — ED Notes (Signed)
Patient transported to Ultrasound 

## 2017-11-27 NOTE — ED Triage Notes (Signed)
C/o vaginal d/c x 4 days-NAD-steady gait

## 2017-11-27 NOTE — Discharge Instructions (Addendum)
You have been diagnosed today with pregnancy of approximately 6 weeks and 3 days; bacterial vaginosis. At this time there does not appear to be the presence of an emergent medical condition, however there is always the potential for conditions to worsen. Please read and follow the below instructions.  Please return to the Emergency Department immediately for any new or worsening symptoms or if your symptoms do not improve. Please be sure to follow up with your Primary Care Provider as soon as possible regarding your visit today; please call their office to schedule an appointment even if you are feeling better for a follow-up visit. Please follow-up with your OB/GYN as soon as possible for reevaluation of your pregnancy.  Please also discuss your diagnosis of bacterial vaginosis with your OB/GYN to see which treatments they prefer to give you since you are pregnant. Return to the emergency department immediately if you experience vaginal bleeding/abdominal discomfort or any new or concerning symptoms. Your tests for gonorrhea, chlamydia, HIV and syphilis are pending at this time.  You will be contacted for positive results only in the next 2-3 days.  You will not be contacted for negative tests.  You  may check MyChart to review results.  Contact a health care provider if: You have dizziness. You have mild pelvic cramps, pelvic pressure, or nagging pain in the abdominal area. You have persistent nausea, vomiting, or diarrhea. You have a bad smelling vaginal discharge. You have pain when you urinate. You notice increased swelling in your face, hands, legs, or ankles. You are exposed to fifth disease or chickenpox. You are exposed to [place] measles (rubella) and have never had it. Get help right away if: You have a fever. You are leaking fluid from your vagina. You have spotting or bleeding from your vagina. You have severe abdominal cramping or pain. You have rapid weight gain or loss. You  vomit blood or material that looks like coffee grounds. You develop a severe headache. You have shortness of breath. You have any kind of trauma, such as from a fall or a car accident.  Please read the additional information packets attached to your discharge summary.  Do not take your medicine if  develop an itchy rash, swelling in your mouth or lips, or difficulty breathing.

## 2017-11-27 NOTE — ED Provider Notes (Addendum)
[facility] EMERGENCY DEPARTMENT Provider Note   CSN: [id number] Arrival date & time: 11/27/17  1202     History   Chief Complaint Chief Complaint  Patient presents with  . Vaginal Discharge    HPI Lisa Wright is a 32 y.o. female presenting for 4 days of increased vaginal discharge.  Patient describes her vaginal discharge as a thick white fluid that has been constant for the past 4 days.  Patient denies vaginal bleeding or purulent discharge.  Patient states that she is only had one sexual partner and is not concerned for sexually transmitted diseases today.  Patient also endorses a mild cramping sensation to her lower abdomen that has been intermittent for the past 4 days.  Patient states that the sensation is similar to the beginnings of her period. Patient denies cramping sensation today.  Of note patient's last period ended on 09/26/2017.  HPI  Past Medical History:  Diagnosis Date  . Chlamydia   . Migraines   . Trichomonas infection   . Vaginal Pap smear, abnormal     Patient Active Problem List   Diagnosis Date Noted  . Obesity 07/29/2014  . Hidradenitis suppurativa 07/29/2014  . Abnormal uterine bleeding (AUB) 07/03/2013  . CIN I (cervical intraepithelial neoplasia I) 02/16/2013  . ASCUS with positive high risk HPV 11/25/2012    Past Surgical History:  Procedure Laterality Date  . CESAREAN SECTION    . CESAREAN SECTION    . COLPOSCOPY       OB History    Gravida  3   Para  3   Term  3   Preterm      AB      Living  3     SAB      TAB      Ectopic      Multiple      Live Births  3            Home Medications    Prior to Admission medications   Medication Sig Start Date End Date Taking? Authorizing Provider  acidophilus (RISAQUAD) CAPS capsule Take 1 capsule by mouth daily.   Yes [provider]  fluconazole (DIFLUCAN) 150 MG tablet 1 tab po x 1. May repeat in 72 hours if no improvement 10/27/15   Janne Napoleon, NP   methocarbamol (ROBAXIN) 500 MG tablet Take 2 tablets (1,000 mg total) by mouth 4 (four) times daily. 07/28/16   Carlisle Cater, PA-C  metroNIDAZOLE (FLAGYL) 500 MG tablet Take 1 tablet (500 mg total) by mouth 2 (two) times daily. X 7 days 10/27/15   Janne Napoleon, NP  naproxen (NAPROSYN) 500 MG tablet Take 1 tablet (500 mg total) by mouth 2 (two) times daily. 07/28/16   Carlisle Cater, PA-C  Pren-Fe-Meth-FA-Omeg w/o A (PRIMACARE) 30-1-470 MG CAPS Take 1 tablet by mouth daily. 08/16/15   Morene Crocker, CNM    Family History Family History  Problem Relation Age of Onset  . Diabetes Father   . Hypertension Father   . Dementia Maternal Grandmother   . Diabetes Maternal Grandfather   . Diabetes Paternal Grandmother     Social History Social History   Tobacco Use  . Smoking status: Current Every Day Smoker    Packs/day: 0.30    Years: 14.00    Pack years: 4.20    Types: Cigarettes  . Smokeless tobacco: Never Used  . Tobacco comment: 1 pack every 3 days  Substance Use Topics  . Alcohol use:  No    Alcohol/week: 0.0 standard drinks  . Drug use: No     Allergies   Ibuprofen and Percocet [oxycodone-acetaminophen]   Review of Systems Review of Systems  Constitutional: Negative.  Negative for chills and fever.  HENT: Negative.  Negative for rhinorrhea and sore throat.   Eyes: Negative.  Negative for visual disturbance.  Respiratory: Negative.  Negative for cough and shortness of breath.   Cardiovascular: Negative.  Negative for chest pain.  Gastrointestinal: Negative.  Negative for abdominal pain, blood in stool, diarrhea, nausea and vomiting.  Genitourinary: Positive for vaginal discharge. Negative for difficulty urinating, dyspareunia, dysuria, flank pain, hematuria, pelvic pain and vaginal bleeding.  Musculoskeletal: Negative.  Negative for arthralgias and myalgias.  Skin: Negative.  Negative for rash.  Neurological: Negative.  Negative for dizziness, weakness and headaches.    Physical Exam Updated Vital Signs BP 125/75 (BP Location: Right Arm)   Pulse 63   Temp 98.2 F (36.8 C)   Resp 14   Ht 5\' 5"  (1.651 m)   Wt 117.9 kg   LMP 09/26/2017   SpO2 100%   BMI 43.27 kg/m   Physical Exam  Constitutional: She is oriented to person, place, and time. She appears well-developed and well-nourished. No distress.  HENT:  Head: Normocephalic and atraumatic.  Right Ear: External ear normal.  Left Ear: External ear normal.  Nose: Nose normal.  Eyes: Pupils are equal, round, and reactive to light. Conjunctivae and EOM are normal.  Neck: Trachea normal and normal range of motion. No tracheal deviation present.  Cardiovascular: Normal rate, regular rhythm, normal heart sounds and intact distal pulses.  Pulses:      Dorsalis pedis pulses are 2+ on the right side, and 2+ on the left side.       Posterior tibial pulses are 2+ on the right side, and 2+ on the left side.  Pulmonary/Chest: Effort normal and breath sounds normal. No respiratory distress.  Abdominal: Soft. There is no tenderness. There is no rebound and no guarding.  Genitourinary:  Genitourinary Comments: Exam chaperoned by Silver Huguenin RN  Pelvic exam: normal external genitalia without evidence of trauma. VULVA: normal appearing vulva with no masses, tenderness or lesion. VAGINA: normal appearing vagina with normal color and discharge, no lesions. CERVIX: normal appearing cervix without lesions, cervical motion tenderness absent, cervical os closed without purulent discharge;  vaginal discharge - white  Wet prep and DNA probe for chlamydia and GC obtained.     Musculoskeletal: Normal range of motion.  Feet:  Right Foot:  Protective Sensation: 3 sites tested. 3 sites sensed.  Left Foot:  Protective Sensation: 3 sites tested. 3 sites sensed.  Neurological: She is alert and oriented to person, place, and time. GCS eye subscore is 4. GCS verbal subscore is 5. GCS motor subscore is 6.  Speech is clear  and goal oriented, follows commands Major Cranial nerves without deficit, no facial droop Normal strength in upper and lower extremities bilaterally including dorsiflexion and plantar flexion, strong and equal grip strength Sensation normal to light touch Moves extremities without ataxia, coordination intact Normal gait  Skin: Skin is warm and dry. Capillary refill takes less than 2 seconds.  Psychiatric: She has a normal mood and affect. Her behavior is normal.     ED Treatments / Results  Labs (all labs ordered are listed, but only abnormal results are displayed) Labs Reviewed  WET PREP, GENITAL - Abnormal; Notable for the following components:      Result Value  Clue Cells Wet Prep HPF POC PRESENT (*)    WBC, Wet Prep HPF POC MANY (*)    All other components within normal limits  PREGNANCY, URINE - Abnormal; Notable for the following components:   Preg Test, Ur POSITIVE (*)    All other components within normal limits  URINALYSIS, ROUTINE W REFLEX MICROSCOPIC - Abnormal; Notable for the following components:   APPearance CLOUDY (*)    Leukocytes, UA LARGE (*)    All other components within normal limits  URINALYSIS, MICROSCOPIC (REFLEX) - Abnormal; Notable for the following components:   Bacteria, UA MANY (*)    All other components within normal limits  HCG, QUANTITATIVE, PREGNANCY - Abnormal; Notable for the following components:   hCG, Beta Chain, Quant, S 26,131 (*)    All other components within normal limits  URINE CULTURE  RPR  HIV ANTIBODY (ROUTINE TESTING W REFLEX)  GC/CHLAMYDIA PROBE AMP ([place]) NOT AT [facility]    EKG None  Radiology US Ob Transvaginal  Result Date: 11/27/2017 CLINICAL DATA:  Pelvic cramping EXAM: TRANSVAGINAL OB ULTRASOUND TECHNIQUE: Transvaginal ultrasound was performed for complete evaluation of the gestation as well as the maternal uterus, adnexal regions, and pelvic cul-de-sac. COMPARISON:  None. FINDINGS: Intrauterine gestational  sac: Single intrauterine pregnancy Yolk sac:  Visible Embryo:  Poorly visible Cardiac Activity: Visible on cine images Heart Rate: 108 bpm MSD: 16 mm   6 w   3 d Subchorionic hemorrhage:  None visualized. Maternal uterus/adnexae: Left ovary is not seen. The right ovary is within normal limits and measures 5 x 2.5 x 3.2 cm. Corpus luteum in the right ovary. No significant free fluid IMPRESSION: 1. Single intrauterine pregnancy. Cardiac motion visible on the cine images but the embryo is poorly visible likely due to habitus and small size; sonographer was unable to obtain accurate measurements for crown-rump length. Mean sac diameter with therefore measured. Short interval sonographic follow-up suggested for repeat measurement of embryo and dating. Electronically Signed   By: Donavan Foil M.D.   On: 11/27/2017 16:15    Procedures Procedures (including critical care time)  Medications Ordered in ED Medications - No data to display   Initial Impression / Assessment and Plan / ED Course  I have reviewed the triage vital signs and the nursing notes.  Pertinent labs & imaging results that were available during my care of the patient were reviewed by me and considered in my medical decision making (see chart for details).    32 year old female presented today for white vaginal discharge.  Wet prep with clue cells and white blood cells, diagnosis of bacterial vaginosis.  Urine pregnancy positive, quantitative hCG estimates 6-8 weeks.  Ultrasound shows single intrauterine pregnancy with cardiac motion; demented 6 weeks 3 days.  Patient not concern for sexually transmitted disease today, did consent for GC chlamydia, HIV and RPR to be sent off.  Patient aware that she will be contacted for positive results in the next 2 to 3 days and to check online for result review.  Examination today not consistent with cervicitis or PID.  Do not feel that empiric STD treatment in the setting of early pregnancy is  appropriate today.  Urinalysis with leukocytes, no nitrates, do not suspect UTI, urine culture sent.  Ultrasound results discussed at length with patient who states understanding.  Patient states that she sees OB/GYN at [facility] clinic and can make an appointment for this week.  Case discussed with Dr. Venora Maples, [place] in setting of early  pregnancy.  Advises that patient follow-up with OB/GYN for treatment of BV.  Patient afebrile, not tachycardic, not hypotensive well-appearing in no acute distress.  No vaginal bleeding and closed cervical os on evaluation.  No complaint from patient of abdominal pain/pelvic pain upon reevaluation, states that her mild sparse cramp has not occurred since yesterday and has no complaints of pain at this time.  Do not suspect miscarriage at this time.  At this time there does not appear to be any evidence of an acute emergency medical condition and the patient appears stable for discharge with appropriate outpatient follow up. Diagnosis was discussed with patient who verbalizes understanding of care plan and is agreeable to discharge. I have discussed return precautions with patient who verbalizes understanding of return precautions. Patient strongly encouraged to follow-up with their OB/GYN as soon as possible. All questions answered.  Patient's case discussed with Dr. Ronnald Nian, who took over from Dr. Venora Maples at shift change, who agrees with plan to discharge with follow-up.    Note: Portions of this report may have been transcribed using voice recognition software. Every effort was made to ensure accuracy; however, inadvertent computerized transcription errors may still be present.  Final Clinical Impressions(s) / ED Diagnoses   Final diagnoses:  Less than [redacted] weeks gestation of pregnancy  BV (bacterial vaginosis)    ED Discharge Orders    None       Deliah Boston, PA-C 11/27/17 1707    Deliah Boston, PA-C 11/27/17 1708    Jola Schmidt,  MD 11/28/17 1035

## 2017-11-28 LAB — URINE CULTURE

## 2017-11-28 LAB — GC/CHLAMYDIA PROBE AMP (~~LOC~~) NOT AT ARMC
CHLAMYDIA, DNA PROBE: NEGATIVE
NEISSERIA GONORRHEA: NEGATIVE

## 2017-11-28 LAB — HIV ANTIBODY (ROUTINE TESTING W REFLEX): HIV Screen 4th Generation wRfx: NONREACTIVE

## 2017-11-28 LAB — RPR: RPR Ser Ql: NONREACTIVE

## 2018-12-15 ENCOUNTER — Ambulatory Visit: Payer: Medicaid Other | Admitting: Advanced Practice Midwife

## 2019-03-24 ENCOUNTER — Ambulatory Visit: Payer: Medicaid Other | Admitting: Advanced Practice Midwife

## 2019-04-07 ENCOUNTER — Inpatient Hospital Stay (HOSPITAL_COMMUNITY)
Admission: AD | Admit: 2019-04-07 | Discharge: 2019-04-07 | Disposition: A | Discharge status: Home / Self Care | Payer: Medicaid Other | Attending: Obstetrics & Gynecology | Admitting: Obstetrics & Gynecology

## 2019-04-07 ENCOUNTER — Inpatient Hospital Stay (HOSPITAL_COMMUNITY): Payer: Medicaid Other

## 2019-04-07 ENCOUNTER — Other Ambulatory Visit: Payer: Self-pay

## 2019-04-07 ENCOUNTER — Encounter (HOSPITAL_COMMUNITY): Payer: Self-pay | Admitting: Obstetrics & Gynecology

## 2019-04-07 DIAGNOSIS — O209 Hemorrhage in early pregnancy, unspecified: Secondary | ICD-10-CM | POA: Diagnosis not present

## 2019-04-07 DIAGNOSIS — O418X10 Other specified disorders of amniotic fluid and membranes, first trimester, not applicable or unspecified: Secondary | ICD-10-CM

## 2019-04-07 DIAGNOSIS — F17210 Nicotine dependence, cigarettes, uncomplicated: Secondary | ICD-10-CM | POA: Insufficient documentation

## 2019-04-07 DIAGNOSIS — Z885 Allergy status to narcotic agent status: Secondary | ICD-10-CM | POA: Insufficient documentation

## 2019-04-07 DIAGNOSIS — O468X1 Other antepartum hemorrhage, first trimester: Secondary | ICD-10-CM | POA: Diagnosis not present

## 2019-04-07 DIAGNOSIS — O26891 Other specified pregnancy related conditions, first trimester: Secondary | ICD-10-CM

## 2019-04-07 DIAGNOSIS — Z3A09 9 weeks gestation of pregnancy: Secondary | ICD-10-CM | POA: Insufficient documentation

## 2019-04-07 DIAGNOSIS — O99331 Smoking (tobacco) complicating pregnancy, first trimester: Secondary | ICD-10-CM | POA: Insufficient documentation

## 2019-04-07 DIAGNOSIS — O208 Other hemorrhage in early pregnancy: Secondary | ICD-10-CM | POA: Diagnosis not present

## 2019-04-07 DIAGNOSIS — Z3A08 8 weeks gestation of pregnancy: Secondary | ICD-10-CM

## 2019-04-07 DIAGNOSIS — R109 Unspecified abdominal pain: Secondary | ICD-10-CM

## 2019-04-07 DIAGNOSIS — O26851 Spotting complicating pregnancy, first trimester: Secondary | ICD-10-CM | POA: Diagnosis present

## 2019-04-07 DIAGNOSIS — Z3490 Encounter for supervision of normal pregnancy, unspecified, unspecified trimester: Secondary | ICD-10-CM

## 2019-04-07 LAB — CBC
HCT: 35.5 % — ABNORMAL LOW (ref 36.0–46.0)
Hemoglobin: 11.7 g/dL — ABNORMAL LOW (ref 12.0–15.0)
MCH: 30.5 pg (ref 26.0–34.0)
MCHC: 33 g/dL (ref 30.0–36.0)
MCV: 92.7 fL (ref 80.0–100.0)
Platelets: 319 10*3/uL (ref 150–400)
RBC: 3.83 MIL/uL — ABNORMAL LOW (ref 3.87–5.11)
RDW: 13.2 % (ref 11.5–15.5)
WBC: 10.9 10*3/uL — ABNORMAL HIGH (ref 4.0–10.5)
nRBC: 0 % (ref 0.0–0.2)

## 2019-04-07 LAB — URINALYSIS, ROUTINE W REFLEX MICROSCOPIC
Bilirubin Urine: NEGATIVE
Glucose, UA: NEGATIVE mg/dL
Hgb urine dipstick: NEGATIVE
Ketones, ur: NEGATIVE mg/dL
Leukocytes,Ua: NEGATIVE
Nitrite: NEGATIVE
Protein, ur: NEGATIVE mg/dL
Specific Gravity, Urine: 1.023 (ref 1.005–1.030)
pH: 6 (ref 5.0–8.0)

## 2019-04-07 LAB — WET PREP, GENITAL
Clue Cells Wet Prep HPF POC: NONE SEEN
Sperm: NONE SEEN
Trich, Wet Prep: NONE SEEN
Yeast Wet Prep HPF POC: NONE SEEN

## 2019-04-07 LAB — POCT PREGNANCY, URINE: Preg Test, Ur: POSITIVE — AB

## 2019-04-07 LAB — HCG, QUANTITATIVE, PREGNANCY: hCG, Beta Chain, Quant, S: 64448 m[IU]/mL — ABNORMAL HIGH (ref <5)

## 2019-04-07 LAB — ABO/RH: ABO/RH(D): O POS

## 2019-04-07 MED ORDER — PREPLUS 27-1 MG PO TABS: 1.0000 | ORAL_TABLET | Freq: Every day | ORAL | 13 refills | Status: DC

## 2019-04-07 NOTE — Discharge Instructions (Signed)
First Trimester of Pregnancy  The first trimester of pregnancy is from week 1 until the end of week 13 (months 1 through 3). During this time, your baby will begin to develop inside you. At 6-8 weeks, the eyes and face are formed, and the heartbeat can be seen on ultrasound. At the end of 12 weeks, all the baby's organs are formed. Prenatal care is all the medical care you receive before the birth of your baby. Make sure you get good prenatal care and follow all of your doctor's instructions. Follow these instructions at home: Medicines  Take over-the-counter and prescription medicines only as told by your doctor. Some medicines are safe and some medicines are not safe during pregnancy.  Take a prenatal vitamin that contains at least 600 micrograms (mcg) of folic acid.  If you have trouble pooping (constipation), take medicine that will make your stool soft (stool softener) if your doctor approves. Eating and drinking   Eat regular, healthy meals.  Your doctor will tell you the amount of weight gain that is right for you.  Avoid raw meat and uncooked cheese.  If you feel sick to your stomach (nauseous) or throw up (vomit): ? Eat 4 or 5 small meals a day instead of 3 large meals. ? Try eating a few soda crackers. ? Drink liquids between meals instead of during meals.  To prevent constipation: ? Eat foods that are high in fiber, like fresh fruits and vegetables, whole grains, and beans. ? Drink enough fluids to keep your pee (urine) clear or pale yellow. Activity  Exercise only as told by your doctor. Stop exercising if you have cramps or pain in your lower belly (abdomen) or low back.  Do not exercise if it is too hot, too humid, or if you are in a place of great height (high altitude).  Try to avoid standing for long periods of time. Move your legs often if you must stand in one place for a long time.  Avoid heavy lifting.  Wear low-heeled shoes. Sit and stand up  straight.  You can have sex unless your doctor tells you not to. Relieving pain and discomfort  Wear a good support bra if your breasts are sore.  Take warm water baths (sitz baths) to soothe pain or discomfort caused by hemorrhoids. Use hemorrhoid cream if your doctor says it is okay.  Rest with your legs raised if you have leg cramps or low back pain.  If you have puffy, bulging veins (varicose veins) in your legs: ? Wear support hose or compression stockings as told by your doctor. ? Raise (elevate) your feet for 15 minutes, 3-4 times a day. ? Limit salt in your food. Prenatal care  Schedule your prenatal visits by the twelfth week of pregnancy.  Write down your questions. Take them to your prenatal visits.  Keep all your prenatal visits as told by your doctor. This is important. Safety  Wear your seat belt at all times when driving.  Make a list of emergency phone numbers. The list should include numbers for family, friends, the hospital, and police and fire departments. General instructions  Ask your doctor for a referral to a local prenatal class. Begin classes no later than at the start of month 6 of your pregnancy.  Ask for help if you need counseling or if you need help with nutrition. Your doctor can give you advice or tell you where to go for help.  Do not use hot tubs, steam   rooms, or saunas.  Do not douche or use tampons or scented sanitary pads.  Do not cross your legs for long periods of time.  Avoid all herbs and alcohol. Avoid drugs that are not approved by your doctor.  Do not use any tobacco products, including cigarettes, chewing tobacco, and electronic cigarettes. If you need help quitting, ask your doctor. You may get counseling or other support to help you quit.  Avoid cat litter boxes and soil used by cats. These carry germs that can cause birth defects in the baby and can cause a loss of your baby (miscarriage) or stillbirth.  Visit your dentist.  At home, brush your teeth with a soft toothbrush. Be gentle when you floss. Contact a doctor if:  You are dizzy.  You have mild cramps or pressure in your lower belly.  You have a nagging pain in your belly area.  You continue to feel sick to your stomach, you throw up, or you have watery poop (diarrhea).  You have a bad smelling fluid coming from your vagina.  You have pain when you pee (urinate).  You have increased puffiness (swelling) in your face, hands, legs, or ankles. Get help right away if:  You have a fever.  You are leaking fluid from your vagina.  You have spotting or bleeding from your vagina.  You have very bad belly cramping or pain.  You gain or lose weight rapidly.  You throw up blood. It may look like coffee grounds.  You are around people who have German measles, fifth disease, or chickenpox.  You have a very bad headache.  You have shortness of breath.  You have any kind of trauma, such as from a fall or a car accident. Summary  The first trimester of pregnancy is from week 1 until the end of week 13 (months 1 through 3).  To take care of yourself and your unborn baby, you will need to eat healthy meals, take medicines only if your doctor tells you to do so, and do activities that are safe for you and your baby.  Keep all follow-up visits as told by your doctor. This is important as your doctor will have to ensure that your baby is healthy and growing well. This information is not intended to replace advice given to you by your health care provider. Make sure you discuss any questions you have with your health care provider. Document Revised: 05/01/2018 Document Reviewed: 01/17/2016 Elsevier Patient Education  2020 [employer]  Safe Medications in Pregnancy   Acne: Benzoyl Peroxide Salicylic Acid  Backache/Headache: Tylenol: 2 regular strength every 4 hours OR              2 Extra strength every 6  hours  Colds/Coughs/Allergies: Benadryl (alcohol free) 25 mg every 6 hours as needed Breath right strips Claritin Cepacol throat lozenges Chloraseptic throat spray Cold-Eeze- up to three times per day Cough drops, alcohol free Flonase (by prescription only) Guaifenesin Mucinex Robitussin DM (plain only, alcohol free) Saline nasal spray/drops Sudafed (pseudoephedrine) & Actifed ** use only after [redacted] weeks gestation and if you do not have high blood pressure Tylenol Vicks Vaporub Zinc lozenges Zyrtec   Constipation: Colace Ducolax suppositories Fleet enema Glycerin suppositories Metamucil Milk of magnesia Miralax Senokot Smooth move tea  Diarrhea: Kaopectate Imodium A-D  *NO pepto Bismol  Hemorrhoids: Anusol Anusol HC Preparation H Tucks  Indigestion: Tums Maalox Mylanta Zantac  Pepcid  Insomnia: Benadryl (alcohol free) 25mg every 6 hours as needed   Tylenol PM Unisom, no Gelcaps  Leg Cramps: Tums MagGel  Nausea/Vomiting:  Bonine Dramamine Emetrol Ginger extract Sea bands Meclizine  Nausea medication to take during pregnancy:  Unisom (doxylamine succinate 25 mg tablets) Take one tablet daily at bedtime. If symptoms are not adequately controlled, the dose can be increased to a maximum recommended dose of two tablets daily (1/2 tablet in the morning, 1/2 tablet mid-afternoon and one at bedtime). Vitamin B6 100mg tablets. Take one tablet twice a day (up to 200 mg per day).  Skin Rashes: Aveeno products Benadryl cream or 25mg every 6 hours as needed Calamine Lotion 1% cortisone cream  Yeast infection: Gyne-lotrimin 7 Monistat 7   **If taking multiple medications, please check labels to avoid duplicating the same active ingredients **take medication as directed on the label ** Do not exceed 4000 mg of tylenol in 24 hours **Do not take medications that contain aspirin or ibuprofen   

## 2019-04-07 NOTE — MAU Note (Addendum)
preg confirmed at [facility] over a month ago.  Has been bleeding for over a month, has been back to [place]- was told to see OB, also told her she was Vit D def and gest diabetic.  Called [facility], no appts until April, told to come up here.  Sometimes is heavy, sometimes spotting. Has been cramping

## 2019-04-07 NOTE — [facility]U Provider Note (Signed)
History     CSN: [id number]  Arrival date and time: 04/07/19 1315   First Provider Initiated Contact with Patient 04/07/19 1602      Chief Complaint  Patient presents with  . Abdominal Pain  . Vaginal Bleeding   HPI Lisa Wright is a 34 y.o. [id number] at [redacted]w[redacted]d by certain LMP who presents to [facility]U with chief complaint of vaginal spotting. Patient states she has had daily spotting since 02/23/2019. Her spotting is intermittently heavy then resolves without any connection to physical activity.  Patient also c/o abdominal cramping, onset with pregnancy. Her pain score ranges from 3-6/10. She denies aggravating or alleviating factors. She has not taken medication or tried other treatments for this complaint.  She denies sharp abdominal pain, abdominal tenderness, fever or recent illness. She is remote from intercourse.  OB History    Gravida  4   Para  3   Term  3   Preterm      AB      Living  3     SAB      TAB      Ectopic      Multiple      Live Births  3           Past Medical History:  Diagnosis Date  . Chlamydia   . Migraines   . Trichomonas infection   . Vaginal Pap smear, abnormal     Past Surgical History:  Procedure Laterality Date  . CESAREAN SECTION    . CESAREAN SECTION    . COLPOSCOPY      Family History  Problem Relation Age of Onset  . Diabetes Father   . Hypertension Father   . Dementia Maternal Grandmother   . Diabetes Maternal Grandfather   . Diabetes Paternal Grandmother     Social History   Tobacco Use  . Smoking status: Current Every Day Smoker    Packs/day: 0.30    Years: 14.00    Pack years: 4.20    Types: Cigarettes  . Smokeless tobacco: Never Used  . Tobacco comment: 1 pack every 3 days  Substance Use Topics  . Alcohol use: No    Alcohol/week: 0.0 standard drinks  . Drug use: No    Allergies:  Allergies  Allergen Reactions  . Ibuprofen Nausea And Vomiting  . Percocet [Oxycodone-Acetaminophen] Itching     Medications Prior to Admission  Medication Sig Dispense Refill Last Dose  . acidophilus (RISAQUAD) CAPS capsule Take 1 capsule by mouth daily.     . fluconazole (DIFLUCAN) 150 MG tablet 1 tab po x 1. May repeat in 72 hours if no improvement 2 tablet 0   . methocarbamol (ROBAXIN) 500 MG tablet Take 2 tablets (1,000 mg total) by mouth 4 (four) times daily. 20 tablet 0   . metroNIDAZOLE (FLAGYL) 500 MG tablet Take 1 tablet (500 mg total) by mouth 2 (two) times daily. X 7 days 14 tablet 0   . naproxen (NAPROSYN) 500 MG tablet Take 1 tablet (500 mg total) by mouth 2 (two) times daily. 20 tablet 0   . Pren-Fe-Meth-FA-Omeg w/o A (PRIMACARE) 30-1-470 MG CAPS Take 1 tablet by mouth daily. 30 capsule PRN     Review of Systems  Gastrointestinal: Positive for abdominal pain.  Genitourinary: Positive for vaginal bleeding.  Musculoskeletal: Negative for back pain.  All other systems reviewed and are negative.  Physical Exam   Blood pressure (!) 112/52, pulse 64, temperature 98.5 F (36.9 C), temperature source Oral,  resp. rate 18, height 5\' 5"  (1.651 m), weight 109.2 kg, last menstrual period 01/29/2019, SpO2 99 %.  Physical Exam  Nursing note and vitals reviewed. Constitutional: She is oriented to person, place, and time. She appears well-developed and well-nourished.  Cardiovascular: Normal rate and normal heart sounds.  Respiratory: Effort normal and breath sounds normal.  GI: Soft. Bowel sounds are normal. She exhibits no distension. There is no abdominal tenderness. There is no rebound.  Genitourinary:    Uterus normal.     Vaginal discharge present.     Genitourinary Comments: Scant blood-tinged mucuous on speculum exam. Vaginal walls pink and well rugated, cervix visually closed, no lesions noted. Bimanual: cervix 0/thick/posterior, neg CMT, uterus nontender, no adnexal tenderness noted.    Neurological: She is alert and oriented to person, place, and time.  Skin: Skin is warm and  dry.  Psychiatric: She has a normal mood and affect. Her behavior is normal. Judgment and thought content normal.    MAU Course/MDM  Procedures  Patient Vitals for the past 24 hrs:  BP Temp Temp src Pulse Resp SpO2 Height Weight  04/07/19 1731 (!) 122/56 -- -- -- -- -- -- --  04/07/19 1333 (!) 112/52 98.5 F (36.9 C) Oral 64 18 99 % 5\' 5"  (1.651 m) 109.2 kg   Results for orders placed or performed during the hospital encounter of 04/07/19 (from the past 24 hour(s))  Pregnancy, urine POC     Status: Abnormal   Collection Time: 04/07/19  1:41 PM  Result Value Ref Range   Preg Test, Ur POSITIVE (A) NEGATIVE  Urinalysis, Routine w reflex microscopic     Status: None   Collection Time: 04/07/19  2:13 PM  Result Value Ref Range   Color, Urine YELLOW YELLOW   APPearance CLEAR CLEAR   Specific Gravity, Urine 1.023 1.005 - 1.030   pH 6.0 5.0 - 8.0   Glucose, UA NEGATIVE NEGATIVE mg/dL   Hgb urine dipstick NEGATIVE NEGATIVE   Bilirubin Urine NEGATIVE NEGATIVE   Ketones, ur NEGATIVE NEGATIVE mg/dL   Protein, ur NEGATIVE NEGATIVE mg/dL   Nitrite NEGATIVE NEGATIVE   Leukocytes,Ua NEGATIVE NEGATIVE  CBC     Status: Abnormal   Collection Time: 04/07/19  3:07 PM  Result Value Ref Range   WBC 10.9 (H) 4.0 - 10.5 K/uL   RBC 3.83 (L) 3.87 - 5.11 MIL/uL   Hemoglobin 11.7 (L) 12.0 - 15.0 g/dL   HCT 35.5 (L) 36.0 - 46.0 %   MCV 92.7 80.0 - 100.0 fL   MCH 30.5 26.0 - 34.0 pg   MCHC 33.0 30.0 - 36.0 g/dL   RDW 13.2 11.5 - 15.5 %   Platelets 319 150 - 400 K/uL   nRBC 0.0 0.0 - 0.2 %  hCG, quantitative, pregnancy     Status: Abnormal   Collection Time: 04/07/19  3:07 PM  Result Value Ref Range   hCG, Beta Chain, Quant, S 64,448 (H) <5 mIU/mL  ABO/Rh     Status: None   Collection Time: 04/07/19  3:07 PM  Result Value Ref Range   ABO/RH(D) O POS    No rh immune globuloin      NOT A RH IMMUNE GLOBULIN CANDIDATE, PT RH POSITIVE Performed at [facility], [address],  [place], [place] [address]   Wet prep, genital     Status: Abnormal   Collection Time: 04/07/19  5:22 PM   Specimen: PATH Cytology Cervicovaginal Ancillary Only  Result Value Ref  Range   Yeast Wet Prep HPF POC NONE SEEN NONE SEEN   Trich, Wet Prep NONE SEEN NONE SEEN   Clue Cells Wet Prep HPF POC NONE SEEN NONE SEEN   WBC, Wet Prep HPF POC MANY (A) NONE SEEN   Sperm NONE SEEN    US OB Comp Less 14 Wks  Result Date: 04/07/2019 CLINICAL DATA:  Bleeding EXAM: OBSTETRIC <14 WK ULTRASOUND TECHNIQUE: Transabdominal ultrasound was performed for evaluation of the gestation as well as the maternal uterus and adnexal regions. COMPARISON:  None. FINDINGS: Intrauterine gestational sac: Single Yolk sac:  Visualized Embryo:  Visualized Cardiac Activity: Visualized Heart Rate: 169 bpm MSD:    mm    w     d CRL:   22.3 mm   8 w 6 d                  [place] EDC: 11/11/2019 Subchorionic hemorrhage:  Small subchorionic hemorrhage Maternal uterus/adnexae: No adnexal mass or free fluid IMPRESSION: Eight week 6 day intrauterine pregnancy. Fetal heart rate 169 beats per minute. Small subchorionic hemorrhage. Electronically Signed   By: Rolm Baptise M.D.   On: 04/07/2019 17:08   Meds ordered this encounter  Medications  . Prenatal Vit-Fe Fumarate-FA (PREPLUS) 27-1 MG TABS    Sig: Take 1 tablet by mouth daily.    Dispense:  30 tablet    Refill:  13    Order Specific Question:   Supervising Provider    Answer:   [place] Mode [id number]    Assessment and Plan  --34 y.o. [id number] with SIUP at 8w 6d by [place] --Subchorionic hemorrhage, reviewed parameters for pelvic rest --Blood type O POS --Discharge home in stable condition with first trimester precautions  Darlina Rumpf, CNM 04/07/2019, 7:35 PM

## 2019-04-08 LAB — GC/CHLAMYDIA PROBE AMP (~~LOC~~) NOT AT ARMC
Chlamydia: NEGATIVE
Comment: NEGATIVE
Comment: NORMAL
Neisseria Gonorrhea: NEGATIVE

## 2019-04-21 ENCOUNTER — Encounter: Payer: Self-pay | Admitting: Advanced Practice Midwife

## 2019-04-21 ENCOUNTER — Ambulatory Visit (INDEPENDENT_AMBULATORY_CARE_PROVIDER_SITE_OTHER): Payer: Medicaid Other | Admitting: Advanced Practice Midwife

## 2019-04-21 ENCOUNTER — Other Ambulatory Visit: Payer: Self-pay

## 2019-04-21 VITALS — BP 109/56 | HR 66 | Wt 239.0 lb

## 2019-04-21 DIAGNOSIS — O34219 Maternal care for unspecified type scar from previous cesarean delivery: Secondary | ICD-10-CM | POA: Diagnosis not present

## 2019-04-21 DIAGNOSIS — Z3A10 10 weeks gestation of pregnancy: Secondary | ICD-10-CM | POA: Diagnosis not present

## 2019-04-21 DIAGNOSIS — Z3480 Encounter for supervision of other normal pregnancy, unspecified trimester: Secondary | ICD-10-CM | POA: Insufficient documentation

## 2019-04-21 MED ORDER — AMBULATORY NON FORMULARY MEDICATION: 1.0000 | 0 refills | Status: DC

## 2019-04-21 MED ORDER — MUCINEX 600 MG PO TB12: 1200.0000 mg | ORAL_TABLET | Freq: Two times a day (BID) | ORAL | 2 refills | Status: DC | PRN

## 2019-04-21 MED ORDER — PREPLUS 27-1 MG PO TABS: 1.0000 | ORAL_TABLET | Freq: Every day | ORAL | 13 refills | Status: DC

## 2019-04-21 NOTE — Patient Instructions (Addendum)
Second Trimester of Pregnancy The second trimester is from week 14 through week 27 (months 4 through 6). The second trimester is often a time when you feel your best. Your body has adjusted to being pregnant, and you begin to feel better physically. Usually, morning sickness has lessened or quit completely, you may have more energy, and you may have an increase in appetite. The second trimester is also a time when the fetus is growing rapidly. At the end of the sixth month, the fetus is about 9 inches long and weighs about 1 pounds. You will likely begin to feel the baby move (quickening) between 16 and 20 weeks of pregnancy. Body changes during your second trimester Your body continues to go through many changes during your second trimester. The changes vary from woman to woman.  Your weight will continue to increase. You will notice your lower abdomen bulging out.  You may begin to get stretch marks on your hips, abdomen, and breasts.  You may develop headaches that can be relieved by medicines. The medicines should be approved by your health care provider.  You may urinate more often because the fetus is pressing on your bladder.  You may develop or continue to have heartburn as a result of your pregnancy.  You may develop constipation because certain hormones are causing the muscles that push waste through your intestines to slow down.  You may develop hemorrhoids or swollen, bulging veins (varicose veins).  You may have back pain. This is caused by: ? Weight gain. ? Pregnancy hormones that are relaxing the joints in your pelvis. ? A shift in weight and the muscles that support your balance.  Your breasts will continue to grow and they will continue to become tender.  Your gums may bleed and may be sensitive to brushing and flossing.  Dark spots or blotches (chloasma, mask of pregnancy) may develop on your face. This will likely fade after the baby is born.  A dark line from your  belly button to the pubic area (linea nigra) may appear. This will likely fade after the baby is born.  You may have changes in your hair. These can include thickening of your hair, rapid growth, and changes in texture. Some women also have hair loss during or after pregnancy, or hair that feels dry or thin. Your hair will most likely return to normal after your baby is born. What to expect at prenatal visits During a routine prenatal visit:  You will be weighed to make sure you and the fetus are growing normally.  Your blood pressure will be taken.  Your abdomen will be measured to track your baby's growth.  The fetal heartbeat will be listened to.  Any test results from the previous visit will be discussed. Your health care provider may ask you:  How you are feeling.  If you are feeling the baby move.  If you have had any abnormal symptoms, such as leaking fluid, bleeding, severe headaches, or abdominal cramping.  If you are using any tobacco products, including cigarettes, chewing tobacco, and electronic cigarettes.  If you have any questions. Other tests that may be performed during your second trimester include:  Blood tests that check for: ? Low iron levels (anemia). ? High blood sugar that affects pregnant women (gestational diabetes) between 24 and 28 weeks. ? Rh antibodies. This is to check for a protein on red blood cells (Rh factor).  Urine tests to check for infections, diabetes, or protein in the   urine.  An ultrasound to confirm the proper growth and development of the baby.  An amniocentesis to check for possible genetic problems.  Fetal screens for spina bifida and Down syndrome.  HIV (human immunodeficiency virus) testing. Routine prenatal testing includes screening for HIV, unless you choose not to have this test. Follow these instructions at home: Medicines  Follow your health care provider's instructions regarding medicine use. Specific medicines may be  either safe or unsafe to take during pregnancy.  Take a prenatal vitamin that contains at least 600 micrograms (mcg) of folic acid.  If you develop constipation, try taking a stool softener if your health care provider approves. Eating and drinking   Eat a balanced diet that includes fresh fruits and vegetables, whole grains, good sources of protein such as meat, eggs, or tofu, and low-fat dairy. Your health care provider will help you determine the amount of weight gain that is right for you.  Avoid raw meat and uncooked cheese. These carry germs that can cause birth defects in the baby.  If you have low calcium intake from food, talk to your health care provider about whether you should take a daily calcium supplement.  Limit foods that are high in fat and processed sugars, such as fried and sweet foods.  To prevent constipation: ? Drink enough fluid to keep your urine clear or pale yellow. ? Eat foods that are high in fiber, such as fresh fruits and vegetables, whole grains, and beans. Activity  Exercise only as directed by your health care provider. Most women can continue their usual exercise routine during pregnancy. Try to exercise for 30 minutes at least 5 days a week. Stop exercising if you experience uterine contractions.  Avoid heavy lifting, wear low heel shoes, and practice good posture.  A sexual relationship may be continued unless your health care provider directs you otherwise. Relieving pain and discomfort  Wear a good support bra to prevent discomfort from breast tenderness.  Take warm sitz baths to soothe any pain or discomfort caused by hemorrhoids. Use hemorrhoid cream if your health care provider approves.  Rest with your legs elevated if you have leg cramps or low back pain.  If you develop varicose veins, wear support hose. Elevate your feet for 15 minutes, 3-4 times a day. Limit salt in your diet. Prenatal Care  Write down your questions. Take them to  your prenatal visits.  Keep all your prenatal visits as told by your health care provider. This is important. Safety  Wear your seat belt at all times when driving.  Make a list of emergency phone numbers, including numbers for family, friends, the hospital, and police and fire departments. General instructions  Ask your health care provider for a referral to a local prenatal education class. Begin classes no later than the beginning of month 6 of your pregnancy.  Ask for help if you have counseling or nutritional needs during pregnancy. Your health care provider can offer advice or refer you to specialists for help with various needs.  Do not use hot tubs, steam rooms, or saunas.  Do not douche or use tampons or scented sanitary pads.  Do not cross your legs for long periods of time.  Avoid cat litter boxes and soil used by cats. These carry germs that can cause birth defects in the baby and possibly loss of the fetus by miscarriage or stillbirth.  Avoid all smoking, herbs, alcohol, and unprescribed drugs. Chemicals in these products can affect the formation   and growth of the baby.  Do not use any products that contain nicotine or tobacco, such as cigarettes and e-cigarettes. If you need help quitting, ask your health care provider.  Visit your dentist if you have not gone yet during your pregnancy. Use a soft toothbrush to brush your teeth and be gentle when you floss. Contact a health care provider if:  You have dizziness.  You have mild pelvic cramps, pelvic pressure, or nagging pain in the abdominal area.  You have persistent nausea, vomiting, or diarrhea.  You have a bad smelling vaginal discharge.  You have pain when you urinate. Get help right away if:  You have a fever.  You are leaking fluid from your vagina.  You have spotting or bleeding from your vagina.  You have severe abdominal cramping or pain.  You have rapid weight gain or weight loss.  You have  shortness of breath with chest pain.  You notice sudden or extreme swelling of your face, hands, ankles, feet, or legs.  You have not felt your baby move in over an hour.  You have severe headaches that do not go away when you take medicine.  You have vision changes. Summary  The second trimester is from week 14 through week 27 (months 4 through 6). It is also a time when the fetus is growing rapidly.  Your body goes through many changes during pregnancy. The changes vary from woman to woman.  Avoid all smoking, herbs, alcohol, and unprescribed drugs. These chemicals affect the formation and growth your baby.  Do not use any tobacco products, such as cigarettes, chewing tobacco, and e-cigarettes. If you need help quitting, ask your health care provider.  Contact your health care provider if you have any questions. Keep all prenatal visits as told by your health care provider. This is important. This information is not intended to replace advice given to you by your health care provider. Make sure you discuss any questions you have with your health care provider. Document Revised: 05/02/2018 Document Reviewed: 02/14/2016 Elsevier Patient Education  2020 [place] During Pregnancy Genetic testing during pregnancy is also called prenatal genetic testing. This type of testing can determine if your baby is at risk of being born with a disorder caused by abnormal genes or chromosomes (genetic disorder). Chromosomes contain genes that control how your baby will develop in your womb. There are many different genetic disorders. Examples of genetic disorders that may be found through genetic testing include Down syndrome and cystic fibrosis. Gene changes (mutations) can be passed down through families. Genetic testing is offered to all women before or during pregnancy. You can choose whether to have genetic testing. Why is genetic testing done? Genetic testing is done during  pregnancy to find out whether your child is at risk for a genetic disorder. Having genetic testing allows you to:  Discuss your test results and options with a genetic counselor.  Prepare for a baby that may be born with a genetic disorder. Learning about the disorder ahead of time helps you be better prepared to manage it. Your health care providers can also be prepared in case your baby requires special care before or after birth.  Consider whether you want to continue with the pregnancy. In some cases, genetic testing may be done to learn about the traits a child will inherit. Types of genetic tests There are two basic types of genetic testing. Screening tests indicate whether your developing baby (fetus) is  at higher risk for a genetic disorder. Diagnostic tests check actual fetal cells to diagnose a genetic disorder. Screening tests     Screening tests will not harm your baby. They are recommended for all pregnant women. Types of screening tests include:  Carrier screening. This test involves checking genes from both parents by testing their blood or saliva. The test checks to find out if the parents carry a genetic mutation that may be passed to a baby. In most cases, both parents must carry the mutation for a baby to be at risk.  First trimester screening. This test combines a blood test with sound wave imaging of your baby (fetal ultrasound). This screening test checks for a risk of Down syndrome or other defects caused by having extra chromosomes. It also checks for defects of the heart, abdomen, or skeleton.  Second trimester screening also combines a blood test with a fetal ultrasound exam. It checks for a risk of genetic defects of the face, brain, spine, heart, or limbs.  Combined or sequential screening. This type of testing combines the results of first and second trimester screening. This type of testing may be more accurate than first or second trimester screening  alone.  Cell-free DNA testing. This is a blood test that detects cells released by the placenta that get into the mother's blood. It can be used to check for a risk of Down syndrome, other extra chromosome syndromes, and disorders caused by abnormal numbers of sex chromosomes. This test can be done any time after 10 weeks of pregnancy.  Diagnostic tests Diagnostic tests carry slight risks of problems, including bleeding, infection, and loss of the pregnancy. These tests are done only if your baby is at risk for a genetic disorder. You may meet with a genetic counselor to discuss the risks and benefits before having diagnostic tests. Examples of diagnostic tests include:  Chorionic villus sampling (CVS). This involves a procedure to remove and test a sample of cells taken from the placenta. The procedure may be done between 10 and 12 weeks of pregnancy.  Amniocentesis. This involves a procedure to remove and test a sample of fluid (amniotic fluid) and cells from the sac that surrounds the developing baby. The procedure may be done between 15 and 20 weeks of pregnancy. What do the results mean? For a screening test:  If the results are negative, it often means that your child is not at higher risk. There is still a slight chance your child could have a genetic disorder.  If the results are positive, it does not mean your child will have a genetic disorder. It may mean that your child has a higher-than-normal risk for a genetic disorder. In that case, you may want to talk with a genetic counselor about whether you should have diagnostic genetic tests. For a diagnostic test:  If the result is negative, it is unlikely that your child will have a genetic disorder.  If the test is positive for a genetic disorder, it is likely that your child will have the disorder. The test may not tell how severe the disorder will be. Talk with your health care provider about your options. Questions to ask your health  care provider Before talking to your health care provider about genetic testing, find out if there is a history of genetic disorders in your family. It may also help to know your family's ethnic origins. Then ask your health care provider the following questions:  Is my baby at risk  for a genetic disorder?  What are the benefits of having genetic screening?  What tests are best for me and my baby?  What are the risks of each test?  If I get a positive result on a screening test, what is the next step?  Should I meet with a genetic counselor before having a diagnostic test?  Should my partner or other members of my family be tested?  How much do the tests cost? Will my insurance cover the testing? Summary  Genetic testing is done during pregnancy to find out whether your child is at risk for a genetic disorder.  Genetic testing is offered to all women before or during pregnancy. You can choose whether to have genetic testing.  There are two basic types of genetic testing. Screening tests indicate whether your developing baby (fetus) is at higher risk for a genetic disorder. Diagnostic tests check actual fetal cells to diagnose a genetic disorder.  If a diagnostic genetic test is positive, talk with your health care provider about your options. This information is not intended to replace advice given to you by your health care provider. Make sure you discuss any questions you have with your health care provider. Document Revised: 05/01/2018 Document Reviewed: 03/25/2017 Elsevier Patient Education  [place].

## 2019-04-21 NOTE — Progress Notes (Signed)
Subjective:    Lisa Wright is a [id number] [redacted]w[redacted]d being seen today for her first obstetrical visit.  Her obstetrical history is significant for obesity and possible chronic HTN (told at primary MD BP was up). Patient does intend to breast feed. Pregnancy history fully reviewed.  Patient reports no complaints. Except coughing this week. States it is from her smoking. No fever, chills or sore throat.   Vitals:   04/21/19 0910  BP: (!) 109/56  Pulse: 66  Weight: 239 lb 0.6 oz (108.4 kg)    HISTORY: OB History  Gravida Para Term Preterm AB Living  5 3 3   1 3   SAB TAB Ectopic Multiple Live Births    1     3    # Outcome Date GA Lbr Len/2nd Weight Sex Delivery Anes PTL Lv  5 Current           4 TAB 11/2017          3 Term 06/01/09 [redacted]w[redacted]d  5 lb 4 oz (2.381 kg) M CS-LTranv EPI  LIV  2 Term 03/11/07 [redacted]w[redacted]d  5 lb 6 oz (2.438 kg) M CS-LTranv EPI  LIV  1 Term 04/21/04 [redacted]w[redacted]d  6 lb 10 oz (3.005 kg) M Vag-Spont EPI  LIV   Past Medical History:  Diagnosis Date  . Chlamydia   . Migraines   . Trichomonas infection   . Vaginal Pap smear, abnormal    Past Surgical History:  Procedure Laterality Date  . CESAREAN SECTION    . CESAREAN SECTION    . COLPOSCOPY     Family History  Problem Relation Age of Onset  . Diabetes Father   . Hypertension Father   . Dementia Maternal Grandmother   . Diabetes Maternal Grandfather   . Diabetes Paternal Grandmother      Exam    Uterus:     Pelvic Exam:    Perineum: Not examined due to recent exam   Vulva: deferred   Vagina:  normal discharge   pH:    Cervix: deferred   Adnexa: not evaluated   Bony Pelvis: gynecoid  System: Breast:  normal appearance, no masses or tenderness   Skin: normal coloration and turgor, no rashes    Neurologic: oriented, grossly non-focal   Extremities: normal strength, tone, and muscle mass   HEENT neck supple with midline trachea   Mouth/Teeth mucous membranes moist, pharynx normal without lesions   Neck supple    Cardiovascular: regular rate and rhythm   Respiratory:  appears well, vitals normal, no respiratory distress, acyanotic, normal RR, ear and throat exam is normal, neck free of mass or lymphadenopathy, chest clear, no wheezing, crepitations, rhonchi, normal symmetric air entry   Abdomen: soft, non-tender; bowel sounds normal; no masses,  no organomegaly   Urinary: n/a      Assessment:    Pregnancy: [id number] Patient Active Problem List   Diagnosis Date Noted  . Supervision of other normal pregnancy, antepartum 04/21/2019  . Obesity 07/29/2014  . Hidradenitis suppurativa 07/29/2014  . Abnormal uterine bleeding (AUB) 07/03/2013  . CIN I (cervical intraepithelial neoplasia I) 02/16/2013  . ASCUS with positive high risk HPV 11/25/2012        Plan:     Initial labs drawn. Prenatal vitamins. Problem list reviewed and updated. Genetic Screening discussed Panorama: requested.  Ultrasound discussed; fetal survey: requested.  Follow up in 4 weeks. 50% of 30 min visit spent on counseling and coordination of care.    Plans repeat C/S  due to having had tow prior ones.  Likely to be scheduled at 39wks  Welcomed back to practice Initial labs drawn. Continue prenatal vitamins. Discussed and offered genetic screening options, including Quad screen/AFP, NIPS testing, and option to decline testing. Benefits/risks/alternatives reviewed. Pt aware that anatomy US is form of genetic screening with lower accuracy in detecting trisomies than blood work.  Pt chooses genetic screening today. NIPS: ordered. Ultrasound discussed; fetal anatomic survey: ordered. Problem list reviewed and updated. The nature of [facility] with multiple MDs and other Advanced Practice Providers was explained to patient; also emphasized that residents, students are part of our team. Routine obstetric precautions reviewed.  Plan Panorama + AFP at next visit   Hansel Feinstein 04/21/2019

## 2019-04-21 NOTE — Progress Notes (Signed)
Fetal heart rate 164 bpm with abdominal ultrasound. Kathrene Alu RN

## 2019-04-22 LAB — HEMOGLOBIN A1C
Est. average glucose Bld gHb Est-mCnc: 97 mg/dL
Hgb A1c MFr Bld: 5 % (ref 4.8–5.6)

## 2019-04-22 LAB — HEPATITIS C ANTIBODY: Hep C Virus Ab: 0.2 s/co ratio (ref 0.0–0.9)

## 2019-04-23 LAB — URINE CULTURE, OB REFLEX

## 2019-04-23 LAB — CULTURE, OB URINE

## 2019-04-28 LAB — SMN1 COPY NUMBER ANALYSIS (SMA CARRIER SCREENING)

## 2019-04-28 LAB — OBSTETRIC PANEL, INCLUDING HIV
Antibody Screen: NEGATIVE
Basophils Absolute: 0 10*3/uL (ref 0.0–0.2)
Basos: 0 %
EOS (ABSOLUTE): 0.1 10*3/uL (ref 0.0–0.4)
Eos: 1 %
HIV Screen 4th Generation wRfx: NONREACTIVE
Hematocrit: 35.4 % (ref 34.0–46.6)
Hemoglobin: 12.3 g/dL (ref 11.1–15.9)
Hepatitis B Surface Ag: NEGATIVE
Immature Grans (Abs): 0 10*3/uL (ref 0.0–0.1)
Immature Granulocytes: 0 %
Lymphocytes Absolute: 2.4 10*3/uL (ref 0.7–3.1)
Lymphs: 22 %
MCH: 31 pg (ref 26.6–33.0)
MCHC: 34.7 g/dL (ref 31.5–35.7)
MCV: 89 fL (ref 79–97)
Monocytes Absolute: 0.9 10*3/uL (ref 0.1–0.9)
Monocytes: 9 %
Neutrophils Absolute: 7 10*3/uL (ref 1.4–7.0)
Neutrophils: 68 %
Platelets: 320 10*3/uL (ref 150–450)
RBC: 3.97 x10E6/uL (ref 3.77–5.28)
RDW: 11.8 % (ref 11.7–15.4)
RPR Ser Ql: NONREACTIVE
Rh Factor: POSITIVE
Rubella Antibodies, IGG: 1.23 index (ref >0.99)
WBC: 10.5 10*3/uL (ref 3.4–10.8)

## 2019-04-28 LAB — CYSTIC FIBROSIS GENE TEST

## 2019-05-21 ENCOUNTER — Ambulatory Visit (INDEPENDENT_AMBULATORY_CARE_PROVIDER_SITE_OTHER): Payer: Medicaid Other | Admitting: Family Medicine

## 2019-05-21 ENCOUNTER — Other Ambulatory Visit: Payer: Self-pay

## 2019-05-21 VITALS — BP 110/50 | HR 67 | Wt 232.0 lb

## 2019-05-21 DIAGNOSIS — Z3480 Encounter for supervision of other normal pregnancy, unspecified trimester: Secondary | ICD-10-CM

## 2019-05-21 MED ORDER — ONDANSETRON 4 MG PO TBDP: 4.0000 mg | ORAL_TABLET | Freq: Four times a day (QID) | ORAL | 0 refills | Status: DC | PRN

## 2019-05-21 MED ORDER — POLYETHYLENE GLYCOL 3350 17 G PO PACK: 17.0000 g | PACK | Freq: Every day | ORAL | 3 refills | Status: DC

## 2019-05-21 MED ORDER — OMEPRAZOLE 40 MG PO CPDR: 40.0000 mg | DELAYED_RELEASE_CAPSULE | Freq: Every day | ORAL | 3 refills | Status: DC

## 2019-05-21 NOTE — Progress Notes (Signed)
   PRENATAL VISIT NOTE  Subjective:  Lisa Wright is a 34 y.o. [id number] at [redacted]w[redacted]d being seen today for ongoing prenatal care.  She is currently monitored for the following issues for this low-risk pregnancy and has Abnormal uterine bleeding (AUB); Obesity; Hidradenitis suppurativa; and Supervision of other normal pregnancy, antepartum on their problem list.  Patient reports heartburn, vomiting and constipation.  Contractions: Not present. Vag. Bleeding: None.  Movement: Absent. Denies leaking of fluid.   The following portions of the patient's history were reviewed and updated as appropriate: allergies, current medications, past family history, past medical history, past social history, past surgical history and problem list.   Objective:   Vitals:   05/21/19 1419 05/21/19 1427  BP: (!) 118/48 (!) 110/50  Pulse: 66 67  Weight: 232 lb (105.2 kg)     Fetal Status:     Movement: Absent     General:  Alert, oriented and cooperative. Patient is in no acute distress.  Skin: Skin is warm and dry. No rash noted.   Cardiovascular: Normal heart rate noted  Respiratory: Normal respiratory effort, no problems with respiration noted  Abdomen: Soft, gravid, appropriate for gestational age.  Pain/Pressure: Present     Pelvic: Cervical exam deferred        Extremities: Normal range of motion.  Edema: Trace  Mental Status: Normal mood and affect. Normal behavior. Normal judgment and thought content.   Assessment and Plan:  Pregnancy: [id number] at [redacted]w[redacted]d 1. Supervision of other normal pregnancy, antepartum FHT and FH normal. Miralax for constipation, omeprazole for heartburn, zofran for nausea. - AFP, Serum, Open Spina Bifida - Genetic Screening  Preterm labor symptoms and general obstetric precautions including but not limited to vaginal bleeding, contractions, leaking of fluid and fetal movement were reviewed in detail with the patient. Please refer to After Visit Summary for other counseling  recommendations.   Return in about 4 weeks (around 06/18/2019) for OB f/u.  Future Appointments  Date Time Provider [place]  06/17/2019 10:15 AM [place] 2 [facility]  06/18/2019  3:30 PM Lavonia Drafts, MD [facility] None    Truett Mainland, DO

## 2019-05-23 LAB — AFP, SERUM, OPEN SPINA BIFIDA
AFP MoM: 0.97
AFP Value: 22.7 ng/mL
Gest. Age on Collection Date: 15.1 weeks
Maternal Age At EDD: 33.5 yr
OSBR Risk 1 IN: 10000
Test Results:: NEGATIVE
Weight: 232 [lb_av]

## 2019-06-17 ENCOUNTER — Other Ambulatory Visit: Payer: Self-pay

## 2019-06-17 ENCOUNTER — Other Ambulatory Visit: Payer: Self-pay | Admitting: *Deleted

## 2019-06-17 ENCOUNTER — Ambulatory Visit (HOSPITAL_COMMUNITY): Payer: Medicaid Other | Attending: Obstetrics and Gynecology

## 2019-06-17 DIAGNOSIS — Z362 Encounter for other antenatal screening follow-up: Secondary | ICD-10-CM

## 2019-06-17 DIAGNOSIS — O34219 Maternal care for unspecified type scar from previous cesarean delivery: Secondary | ICD-10-CM | POA: Insufficient documentation

## 2019-06-17 DIAGNOSIS — Z363 Encounter for antenatal screening for malformations: Secondary | ICD-10-CM

## 2019-06-17 DIAGNOSIS — Z3A19 19 weeks gestation of pregnancy: Secondary | ICD-10-CM

## 2019-06-17 DIAGNOSIS — Z3480 Encounter for supervision of other normal pregnancy, unspecified trimester: Secondary | ICD-10-CM | POA: Insufficient documentation

## 2019-06-18 ENCOUNTER — Ambulatory Visit (INDEPENDENT_AMBULATORY_CARE_PROVIDER_SITE_OTHER): Payer: Medicaid Other | Admitting: Obstetrics & Gynecology

## 2019-06-18 ENCOUNTER — Other Ambulatory Visit (HOSPITAL_COMMUNITY)
Admission: RE | Admit: 2019-06-18 | Discharge: 2019-06-18 | Disposition: A | Discharge status: Home / Self Care | Payer: Medicaid Other | Source: Ambulatory Visit | Attending: Obstetrics & Gynecology | Admitting: Obstetrics & Gynecology

## 2019-06-18 ENCOUNTER — Encounter: Payer: Self-pay | Admitting: Obstetrics & Gynecology

## 2019-06-18 VITALS — BP 109/53 | HR 71 | Wt 240.0 lb

## 2019-06-18 DIAGNOSIS — B9689 Other specified bacterial agents as the cause of diseases classified elsewhere: Secondary | ICD-10-CM

## 2019-06-18 DIAGNOSIS — B3731 Acute candidiasis of vulva and vagina: Secondary | ICD-10-CM

## 2019-06-18 DIAGNOSIS — Z3480 Encounter for supervision of other normal pregnancy, unspecified trimester: Secondary | ICD-10-CM

## 2019-06-18 DIAGNOSIS — N760 Acute vaginitis: Secondary | ICD-10-CM

## 2019-06-18 DIAGNOSIS — B373 Candidiasis of vulva and vagina: Secondary | ICD-10-CM

## 2019-06-18 DIAGNOSIS — O99212 Obesity complicating pregnancy, second trimester: Secondary | ICD-10-CM

## 2019-06-18 DIAGNOSIS — Z3A19 19 weeks gestation of pregnancy: Secondary | ICD-10-CM

## 2019-06-18 DIAGNOSIS — O34219 Maternal care for unspecified type scar from previous cesarean delivery: Secondary | ICD-10-CM | POA: Insufficient documentation

## 2019-06-18 NOTE — Patient Instructions (Signed)

## 2019-06-18 NOTE — Progress Notes (Signed)
   PRENATAL VISIT NOTE  Subjective:  Lisa Wright is a 34 y.o. [id number] at [redacted]w[redacted]d being seen today for ongoing prenatal care.  She is currently monitored for the following issues for this low-risk pregnancy and has Abnormal uterine bleeding (AUB); Obesity; Hidradenitis suppurativa; and Supervision of other normal pregnancy, antepartum on their problem list.  Patient reports no complaints.  Contractions: Not present. Vag. Bleeding: None.  Movement: Present. Denies leaking of fluid.   The following portions of the patient's history were reviewed and updated as appropriate: allergies, current medications, past family history, past medical history, past social history, past surgical history and problem list.   Objective:   Vitals:   06/18/19 1553  BP: (!) 109/53  Pulse: 71  Weight: 240 lb (108.9 kg)    Fetal Status:     Movement: Present     General:  Alert, oriented and cooperative. Patient is in no acute distress.  Skin: Skin is warm and dry. No rash noted.   Cardiovascular: Normal heart rate noted  Respiratory: Normal respiratory effort, no problems with respiration noted  Abdomen: Soft, gravid, appropriate for gestational age.  Pain/Pressure: Present     Pelvic: Cervical exam deferred        Extremities: Normal range of motion.  Edema: Trace  Mental Status: Normal mood and affect. Normal behavior. Normal judgment and thought content.   Assessment and Plan:  Pregnancy: [id number] at [redacted]w[redacted]d 1. Supervision of other normal pregnancy, antepartum C/o vaginal itching. Will send swab for yeast  2. Class 2 severe obesity due to excess calories with serious comorbidity in adult, unspecified BMI ([place])  3. Prev c/s x2 For scheduled repeat at 39 weeks  Preterm labor symptoms and general obstetric precautions including but not limited to vaginal bleeding, contractions, leaking of fluid and fetal movement were reviewed in detail with the patient. Please refer to After Visit Summary for other  counseling recommendations.   Return in about 4 weeks (around 07/16/2019) for in person.  Future Appointments  Date Time Provider [employer]  08/26/2019 10:00 AM [facility] US1 [facility]    Lavonia Drafts, MD

## 2019-06-24 LAB — CERVICOVAGINAL ANCILLARY ONLY
Bacterial Vaginitis (gardnerella): POSITIVE — AB
Candida Glabrata: NEGATIVE
Candida Vaginitis: POSITIVE — AB
Comment: NEGATIVE
Comment: NEGATIVE
Comment: NEGATIVE

## 2019-06-25 MED ORDER — METRONIDAZOLE 500 MG PO TABS: 500.0000 mg | ORAL_TABLET | Freq: Two times a day (BID) | ORAL | 0 refills | Status: DC

## 2019-06-25 MED ORDER — TERCONAZOLE 0.4 % VA CREA: 1.0000 | TOPICAL_CREAM | Freq: Every day | VAGINAL | 0 refills | Status: DC

## 2019-06-25 NOTE — Addendum Note (Signed)
Addended by: Lavonia Drafts on: 06/25/2019 11:54 AM   Modules accepted: Orders

## 2019-06-28 ENCOUNTER — Emergency Department (HOSPITAL_BASED_OUTPATIENT_CLINIC_OR_DEPARTMENT_OTHER)
Admission: EM | Admit: 2019-06-28 | Discharge: 2019-06-28 | Disposition: A | Discharge status: Home / Self Care | Payer: Medicaid Other | Attending: Emergency Medicine | Admitting: Emergency Medicine

## 2019-06-28 ENCOUNTER — Encounter (HOSPITAL_BASED_OUTPATIENT_CLINIC_OR_DEPARTMENT_OTHER): Payer: Self-pay | Admitting: Emergency Medicine

## 2019-06-28 ENCOUNTER — Other Ambulatory Visit: Payer: Self-pay

## 2019-06-28 DIAGNOSIS — Z79899 Other long term (current) drug therapy: Secondary | ICD-10-CM | POA: Diagnosis not present

## 2019-06-28 DIAGNOSIS — E860 Dehydration: Secondary | ICD-10-CM

## 2019-06-28 DIAGNOSIS — F17210 Nicotine dependence, cigarettes, uncomplicated: Secondary | ICD-10-CM | POA: Diagnosis not present

## 2019-06-28 DIAGNOSIS — O99332 Smoking (tobacco) complicating pregnancy, second trimester: Secondary | ICD-10-CM | POA: Insufficient documentation

## 2019-06-28 DIAGNOSIS — O219 Vomiting of pregnancy, unspecified: Secondary | ICD-10-CM | POA: Diagnosis present

## 2019-06-28 DIAGNOSIS — Z3A20 20 weeks gestation of pregnancy: Secondary | ICD-10-CM | POA: Insufficient documentation

## 2019-06-28 LAB — CBC WITH DIFFERENTIAL/PLATELET
Abs Immature Granulocytes: 0.05 10*3/uL (ref 0.00–0.07)
Basophils Absolute: 0 10*3/uL (ref 0.0–0.1)
Basophils Relative: 0 %
Eosinophils Absolute: 0.2 10*3/uL (ref 0.0–0.5)
Eosinophils Relative: 2 %
HCT: 33.3 % — ABNORMAL LOW (ref 36.0–46.0)
Hemoglobin: 11.1 g/dL — ABNORMAL LOW (ref 12.0–15.0)
Immature Granulocytes: 0 %
Lymphocytes Relative: 23 %
Lymphs Abs: 3.4 10*3/uL (ref 0.7–4.0)
MCH: 31 pg (ref 26.0–34.0)
MCHC: 33.3 g/dL (ref 30.0–36.0)
MCV: 93 fL (ref 80.0–100.0)
Monocytes Absolute: 1.2 10*3/uL — ABNORMAL HIGH (ref 0.1–1.0)
Monocytes Relative: 8 %
Neutro Abs: 9.9 10*3/uL — ABNORMAL HIGH (ref 1.7–7.7)
Neutrophils Relative %: 67 %
Platelets: 263 10*3/uL (ref 150–400)
RBC: 3.58 MIL/uL — ABNORMAL LOW (ref 3.87–5.11)
RDW: 12.9 % (ref 11.5–15.5)
WBC: 14.9 10*3/uL — ABNORMAL HIGH (ref 4.0–10.5)
nRBC: 0 % (ref 0.0–0.2)

## 2019-06-28 LAB — BASIC METABOLIC PANEL
Anion gap: 7 (ref 5–15)
BUN: 7 mg/dL (ref 6–20)
CO2: 22 mmol/L (ref 22–32)
Calcium: 8.4 mg/dL — ABNORMAL LOW (ref 8.9–10.3)
Chloride: 106 mmol/L (ref 98–111)
Creatinine, Ser: 0.41 mg/dL — ABNORMAL LOW (ref 0.44–1.00)
GFR calc Af Amer: >60 mL/min (ref >60)
GFR calc non Af Amer: >60 mL/min (ref >60)
Glucose, Bld: 92 mg/dL (ref 70–99)
Potassium: 3.7 mmol/L (ref 3.5–5.1)
Sodium: 135 mmol/L (ref 135–145)

## 2019-06-28 MED ORDER — SODIUM CHLORIDE 0.9 % IV BOLUS
1000.0000 mL | Freq: Once | INTRAVENOUS | Status: AC
  Administered 2019-06-28: 1000 mL via INTRAVENOUS

## 2019-06-28 NOTE — ED Notes (Signed)
Patient was d/c home by primary RN Guerry Bruin, RN

## 2019-06-28 NOTE — ED Triage Notes (Signed)
Vomiting and diarrhea this week. [redacted] weeks pregnant. Denies vaginal bleeding or discharge. Currently being treated for BV

## 2019-06-28 NOTE — ED Provider Notes (Signed)
[facility] EMERGENCY DEPARTMENT Provider Note   CSN: [id number] Arrival date & time: 06/28/19  1736     History Chief Complaint  Patient presents with  . Emesis    [redacted] week pregnant  . Diarrhea    Lisa Wright is a 34 y.o. female.  Patient is a 34 year old female who is G5 P3013, [redacted] weeks pregnant who presents with vomiting diarrhea.  She says she has a 5-day history of vomiting and diarrhea.  She says actually the vomiting and diarrhea have improved but she feels like she is dehydrated.  She tried to eat a meatball today and she got nauseated but has not had any vomiting or diarrhea in the last 2 days.  No abdominal pain.  No leakage of fluid.  She says she is feeling the baby move around.  No known fevers.  No cough or cold symptoms.  No dizziness.  She says she just feels weak and feels like she may be dehydrated.  She was recently treated for bacterial vaginosis and yeast infection.  No current discharge.        Past Medical History:  Diagnosis Date  . Chlamydia   . Migraines   . Trichomonas infection   . Vaginal Pap smear, abnormal     Patient Active Problem List   Diagnosis Date Noted  . Previous cesarean delivery affecting pregnancy, antepartum 06/18/2019  . Supervision of other normal pregnancy, antepartum 04/21/2019  . Obesity 07/29/2014  . Hidradenitis suppurativa 07/29/2014  . Abnormal uterine bleeding (AUB) 07/03/2013    Past Surgical History:  Procedure Laterality Date  . CESAREAN SECTION    . CESAREAN SECTION    . COLPOSCOPY       OB History    Gravida  5   Para  3   Term  3   Preterm      AB  1   Living  3     SAB      TAB  1   Ectopic      Multiple      Live Births  3           Family History  Problem Relation Age of Onset  . Diabetes Father   . Hypertension Father   . Dementia Maternal Grandmother   . Diabetes Maternal Grandfather   . Diabetes Paternal Grandmother     Social History   Tobacco Use  .  Smoking status: Current Every Day Smoker    Packs/day: 0.30    Years: 14.00    Pack years: 4.20    Types: Cigarettes  . Smokeless tobacco: Never Used  . Tobacco comment: 1 pack every 3 days  Substance Use Topics  . Alcohol use: No    Alcohol/week: 0.0 standard drinks  . Drug use: No    Home Medications Prior to Admission medications   Medication Sig Start Date End Date Taking? Authorizing Provider  AMBULATORY NON FORMULARY MEDICATION 1 Device by Other route once a week. Blood pressure cuff/ Large  Monitored Regularly at home  ICD 10 Z34.90 LROB Patient not taking: Reported on 05/21/2019 04/21/19   Seabron Spates, CNM  guaiFENesin (MUCINEX) 600 MG 12 hr tablet Take 2 tablets (1,200 mg total) by mouth 2 (two) times daily as needed for cough. Patient not taking: Reported on 05/21/2019 04/21/19   Seabron Spates, CNM  metroNIDAZOLE (FLAGYL) 500 MG tablet Take 1 tablet (500 mg total) by mouth 2 (two) times daily. 06/25/19   Lavonia Drafts,  MD  omeprazole (PRILOSEC) 40 MG capsule Take 1 capsule (40 mg total) by mouth daily. 05/21/19   Truett Mainland, DO  ondansetron (ZOFRAN ODT) 4 MG disintegrating tablet Take 1 tablet (4 mg total) by mouth every 6 (six) hours as needed for nausea. 05/21/19   Truett Mainland, DO  polyethylene glycol (MIRALAX) 17 g packet Take 17 g by mouth daily. 05/21/19   Truett Mainland, DO  Prenatal Vit-Fe Fumarate-FA (PREPLUS) 27-1 MG TABS Take 1 tablet by mouth daily. 04/21/19   Seabron Spates, CNM  terconazole (TERAZOL 7) 0.4 % vaginal cream Place 1 applicator vaginally at bedtime. 06/25/19   Lavonia Drafts, MD    Allergies    Ibuprofen and Percocet [oxycodone-acetaminophen]  Review of Systems   Review of Systems  Constitutional: Positive for fatigue. Negative for chills, diaphoresis and fever.  HENT: Negative for congestion, rhinorrhea and sneezing.   Eyes: Negative.   Respiratory: Negative for cough, chest tightness and shortness of breath.    Cardiovascular: Negative for chest pain and leg swelling.  Gastrointestinal: Positive for diarrhea, nausea and vomiting. Negative for abdominal pain and blood in stool.  Genitourinary: Negative for difficulty urinating, flank pain, frequency, hematuria, vaginal bleeding, vaginal discharge and vaginal pain.  Musculoskeletal: Negative for arthralgias and back pain.  Skin: Negative for rash.  Neurological: Negative for dizziness, speech difficulty, weakness, numbness and headaches.    Physical Exam Updated Vital Signs BP 112/63 (BP Location: Left Arm)   Pulse 82   Temp 98.6 F (37 C) (Oral)   Resp 18   Ht 5\' 6"  (1.676 m)   Wt 108.9 kg   LMP 01/29/2019   SpO2 96%   BMI 38.74 kg/m   Physical Exam Constitutional:      Appearance: She is well-developed.  HENT:     Head: Normocephalic and atraumatic.  Eyes:     Pupils: Pupils are equal, round, and reactive to light.  Cardiovascular:     Rate and Rhythm: Normal rate and regular rhythm.     Heart sounds: Normal heart sounds.  Pulmonary:     Effort: Pulmonary effort is normal. No respiratory distress.     Breath sounds: Normal breath sounds. No wheezing or rales.  Chest:     Chest wall: No tenderness.  Abdominal:     General: Bowel sounds are normal.     Palpations: Abdomen is soft.     Tenderness: There is no abdominal tenderness. There is no guarding or rebound.     Comments: Gravid uterus  Musculoskeletal:        General: Normal range of motion.     Cervical back: Normal range of motion and neck supple.  Lymphadenopathy:     Cervical: No cervical adenopathy.  Skin:    General: Skin is warm and dry.     Findings: No rash.  Neurological:     Mental Status: She is alert and oriented to person, place, and time.     ED Results / Procedures / Treatments   Labs (all labs ordered are listed, but only abnormal results are displayed) Labs Reviewed  BASIC METABOLIC PANEL - Abnormal; Notable for the following components:       Result Value   Creatinine, Ser 0.41 (*)    Calcium 8.4 (*)    All other components within normal limits  CBC WITH DIFFERENTIAL/PLATELET - Abnormal; Notable for the following components:   WBC 14.9 (*)    RBC 3.58 (*)    Hemoglobin 11.1 (*)  HCT 33.3 (*)    Neutro Abs 9.9 (*)    Monocytes Absolute 1.2 (*)    All other components within normal limits    EKG None  Radiology No results found.  Procedures Procedures (including critical care time)  Medications Ordered in ED Medications  sodium chloride 0.9 % bolus 1,000 mL ( Intravenous Stopped 06/28/19 1943)    ED Course  I have reviewed the triage vital signs and the nursing notes.  Pertinent labs & imaging results that were available during my care of the patient were reviewed by me and considered in my medical decision making (see chart for details).    MDM Rules/Calculators/A&P                      Patient is a 34 year old female who presents with feeling dehydrated.  She has had some recent vomiting diarrhea this week.  Her symptoms have improved but she has not had a good appetite and feels like she has not had great fluid intake.  Her fetal heart tones are normal.  She does not have any abdominal pain.  No leakage of fluid.  She was given IV fluids.  Her labs are nonconcerning.  She is feeling better and was able to drink fluids without difficulty.  She was discharged home in good condition.  She was encouraged to have follow-up with her OB/GYN.  Return precautions were given.  Final Clinical Impression(s) / ED Diagnoses Final diagnoses:  Dehydration    Rx / DC Orders ED Discharge Orders    None       Malvin Johns, MD 06/28/19 2024

## 2019-06-29 ENCOUNTER — Telehealth: Payer: Self-pay

## 2019-06-29 NOTE — Telephone Encounter (Signed)
Pt called the office stating she was treated for dehydration in the ED last night and was given IV fluids. Pt states she is still feeling dehydrated. Advised pt to go to [facility] to be seen. Understanding was voiced.  Jacquline Terrill l Shyvonne Chastang, CMA

## 2019-07-21 ENCOUNTER — Encounter: Payer: Self-pay | Admitting: Advanced Practice Midwife

## 2019-07-21 ENCOUNTER — Other Ambulatory Visit (HOSPITAL_COMMUNITY)
Admission: RE | Admit: 2019-07-21 | Discharge: 2019-07-21 | Disposition: A | Discharge status: Home / Self Care | Payer: Medicaid Other | Source: Ambulatory Visit | Attending: Advanced Practice Midwife | Admitting: Advanced Practice Midwife

## 2019-07-21 ENCOUNTER — Ambulatory Visit (INDEPENDENT_AMBULATORY_CARE_PROVIDER_SITE_OTHER): Payer: Medicaid Other | Admitting: Advanced Practice Midwife

## 2019-07-21 ENCOUNTER — Other Ambulatory Visit: Payer: Self-pay

## 2019-07-21 VITALS — BP 97/50 | HR 70 | Wt 232.0 lb

## 2019-07-21 DIAGNOSIS — O99212 Obesity complicating pregnancy, second trimester: Secondary | ICD-10-CM

## 2019-07-21 DIAGNOSIS — N898 Other specified noninflammatory disorders of vagina: Secondary | ICD-10-CM | POA: Insufficient documentation

## 2019-07-21 DIAGNOSIS — O34219 Maternal care for unspecified type scar from previous cesarean delivery: Secondary | ICD-10-CM

## 2019-07-21 DIAGNOSIS — E66812 Obesity, class 2: Secondary | ICD-10-CM

## 2019-07-21 DIAGNOSIS — Z3A23 23 weeks gestation of pregnancy: Secondary | ICD-10-CM

## 2019-07-21 DIAGNOSIS — O99891 Other specified diseases and conditions complicating pregnancy: Secondary | ICD-10-CM

## 2019-07-21 DIAGNOSIS — Z3480 Encounter for supervision of other normal pregnancy, unspecified trimester: Secondary | ICD-10-CM

## 2019-07-21 MED ORDER — FLUCONAZOLE 150 MG PO TABS
150.0000 mg | ORAL_TABLET | Freq: Once | ORAL | 1 refills | Status: DC | PRN
Start: 2019-07-21 — End: 2019-09-03

## 2019-07-21 NOTE — Patient Instructions (Signed)

## 2019-07-21 NOTE — Progress Notes (Signed)
   PRENATAL VISIT NOTE  Subjective:  Lisa Wright is a 34 y.o. [id number] at [redacted]w[redacted]d being seen today for ongoing prenatal care.  She is currently monitored for the following issues for this high-risk pregnancy and has Abnormal uterine bleeding (AUB); Obesity; Hidradenitis suppurativa; Supervision of other normal pregnancy, antepartum; and Previous cesarean delivery affecting pregnancy, antepartum on their problem list.  Patient reports vaginal irritation. Thinks it is similar to prior yeast vaginitis.  Also usiing new soap (Dial).   Contractions: Not present. Vag. Bleeding: None.  Movement: Present. Denies leaking of fluid.   The following portions of the patient's history were reviewed and updated as appropriate: allergies, current medications, past family history, past medical history, past social history, past surgical history and problem list.   Objective:   Vitals:   07/21/19 1057  BP: (!) 97/50  Pulse: 70  Weight: 232 lb (105.2 kg)    Fetal Status: Fetal Heart Rate (bpm): 150   Movement: Present     General:  Alert, oriented and cooperative. Patient is in no acute distress.  Skin: Skin is warm and dry. No rash noted.   Cardiovascular: Normal heart rate noted  Respiratory: Normal respiratory effort, no problems with respiration noted  Abdomen: Soft, gravid, appropriate for gestational age.  Pain/Pressure: Present     Pelvic: Cervical exam deferred        Extremities: Normal range of motion.  Edema: Trace  Mental Status: Normal mood and affect. Normal behavior. Normal judgment and thought content.   Assessment and Plan:  Pregnancy: O1V6153 at [redacted]w[redacted]d 1. Supervision of other normal pregnancy, antepartum     Glucola next month  2. Class 2 severe obesity due to excess calories with serious comorbidity in adult, unspecified BMI ([place])     3. Previous cesarean delivery affecting pregnancy, antepartum     Plans repeat   4. Vaginal discharge       Self Swabbed.  Will Rx diflucan.  Recommend using a milder soap like Pears on genitalia.  - Cervicovaginal ancillary only( [place])  Preterm labor symptoms and general obstetric precautions including but not limited to vaginal bleeding, contractions, leaking of fluid and fetal movement were reviewed in detail with the patient. Please refer to After Visit Summary for other counseling recommendations.   Return in about 4 weeks (around 08/18/2019) for [facility].  Future Appointments  Date Time Provider [place]  08/18/2019 10:10 AM Seabron Spates, CNM [facility] None  08/26/2019 10:00 AM [facility] US1 [facility]    Hansel Feinstein, CNM

## 2019-07-22 LAB — CERVICOVAGINAL ANCILLARY ONLY
Bacterial Vaginitis (gardnerella): NEGATIVE
Candida Glabrata: NEGATIVE
Candida Vaginitis: NEGATIVE
Comment: NEGATIVE
Comment: NEGATIVE
Comment: NEGATIVE

## 2019-08-18 ENCOUNTER — Other Ambulatory Visit: Payer: Self-pay

## 2019-08-18 ENCOUNTER — Encounter: Payer: Medicaid Other | Admitting: Advanced Practice Midwife

## 2019-08-26 ENCOUNTER — Other Ambulatory Visit: Payer: Self-pay

## 2019-08-26 ENCOUNTER — Other Ambulatory Visit: Payer: Self-pay | Admitting: *Deleted

## 2019-08-26 ENCOUNTER — Ambulatory Visit: Payer: Medicaid Other | Attending: Obstetrics and Gynecology

## 2019-08-26 DIAGNOSIS — O34219 Maternal care for unspecified type scar from previous cesarean delivery: Secondary | ICD-10-CM

## 2019-08-26 DIAGNOSIS — Z3A29 29 weeks gestation of pregnancy: Secondary | ICD-10-CM

## 2019-08-26 DIAGNOSIS — Z362 Encounter for other antenatal screening follow-up: Secondary | ICD-10-CM | POA: Insufficient documentation

## 2019-09-03 ENCOUNTER — Other Ambulatory Visit: Payer: Self-pay

## 2019-09-03 ENCOUNTER — Ambulatory Visit (INDEPENDENT_AMBULATORY_CARE_PROVIDER_SITE_OTHER): Payer: Medicaid Other | Admitting: Family Medicine

## 2019-09-03 VITALS — BP 106/63 | HR 75 | Wt 231.0 lb

## 2019-09-03 DIAGNOSIS — F439 Reaction to severe stress, unspecified: Secondary | ICD-10-CM

## 2019-09-03 DIAGNOSIS — Z23 Encounter for immunization: Secondary | ICD-10-CM

## 2019-09-03 DIAGNOSIS — O34219 Maternal care for unspecified type scar from previous cesarean delivery: Secondary | ICD-10-CM

## 2019-09-03 DIAGNOSIS — Z3480 Encounter for supervision of other normal pregnancy, unspecified trimester: Secondary | ICD-10-CM

## 2019-09-03 DIAGNOSIS — Z3483 Encounter for supervision of other normal pregnancy, third trimester: Secondary | ICD-10-CM

## 2019-09-03 DIAGNOSIS — Z3A30 30 weeks gestation of pregnancy: Secondary | ICD-10-CM

## 2019-09-03 MED ORDER — CITALOPRAM HYDROBROMIDE 20 MG PO TABS: 20.0000 mg | ORAL_TABLET | Freq: Every day | ORAL | 12 refills | Status: DC

## 2019-09-03 MED ORDER — PANTOPRAZOLE SODIUM 40 MG PO TBEC
40.0000 mg | DELAYED_RELEASE_TABLET | Freq: Every day | ORAL | 3 refills | Status: AC
Start: 2019-09-03 — End: ?

## 2019-09-03 NOTE — Progress Notes (Addendum)
   PRENATAL VISIT NOTE  Subjective:  Lisa Wright is a 34 y.o. [id number] at [redacted]w[redacted]d being seen today for ongoing prenatal care.  She is currently monitored for the following issues for this high-risk pregnancy and has Abnormal uterine bleeding (AUB); Obesity; Hidradenitis suppurativa; Supervision of other normal pregnancy, antepartum; and Previous cesarean delivery affecting pregnancy, antepartum on their problem list.  Patient reports feeling really anxious. Has a lot of stress with family members having strokes and in hospital.  Also having heartburn.  Contractions: Not present. Vag. Bleeding: None.  Movement: Present. Denies leaking of fluid.   The following portions of the patient's history were reviewed and updated as appropriate: allergies, current medications, past family history, past medical history, past social history, past surgical history and problem list.   Objective:   Vitals:   09/03/19 0907  BP: 106/63  Pulse: 75  Weight: 231 lb (104.8 kg)    Fetal Status: Fetal Heart Rate (bpm): 139   Movement: Present     General:  Alert, oriented and cooperative. Patient is in no acute distress.  Skin: Skin is warm and dry. No rash noted.   Cardiovascular: Normal heart rate noted  Respiratory: Normal respiratory effort, no problems with respiration noted  Abdomen: Soft, gravid, appropriate for gestational age.  Pain/Pressure: Absent     Pelvic: Cervical exam deferred        Extremities: Normal range of motion.  Edema: None  Mental Status: Normal mood and affect. Normal behavior. Normal judgment and thought content.   Assessment and Plan:  Pregnancy: G5P3013 at [redacted]w[redacted]d  1. [redacted] weeks gestation of pregnancy - CBC - Glucose Tolerance, 2 Hours w/1 Hour - HIV Antibody (routine testing w rflx) - RPR - Tdap vaccine greater than or equal to 7yo IM  2. Supervision of other normal pregnancy, antepartum FHT and FH normal  3. Class 2 severe obesity due to excess calories with serious  comorbidity in adult, unspecified BMI ([place])   4. Previous cesarean delivery affecting pregnancy, antepartum Elective repeat at 39 weeks.  5. Stress Start celexa.  Preterm labor symptoms and general obstetric precautions including but not limited to vaginal bleeding, contractions, leaking of fluid and fetal movement were reviewed in detail with the patient. Please refer to After Visit Summary for other counseling recommendations.   Return in about 2 weeks (around 09/17/2019) for OB f/u.  Future Appointments  Date Time Provider [place]  09/18/2019  9:45 AM Truett Mainland, DO [facility] None  09/23/2019 11:00 AM [facility] NURSE [facility]  09/23/2019 11:15 AM [facility] US2 [facility] [place], DO

## 2019-09-03 NOTE — Addendum Note (Signed)
Addended by: Truett Mainland on: 09/03/2019 04:52 PM   Modules accepted: Orders

## 2019-09-03 NOTE — Addendum Note (Signed)
Addended by: Truett Mainland on: 09/03/2019 04:53 PM   Modules accepted: Orders

## 2019-09-04 LAB — CBC
Hematocrit: 33.7 % — ABNORMAL LOW (ref 34.0–46.6)
Hemoglobin: 11.3 g/dL (ref 11.1–15.9)
MCH: 30.5 pg (ref 26.6–33.0)
MCHC: 33.5 g/dL (ref 31.5–35.7)
MCV: 91 fL (ref 79–97)
Platelets: 288 10*3/uL (ref 150–450)
RBC: 3.71 x10E6/uL — ABNORMAL LOW (ref 3.77–5.28)
RDW: 12.1 % (ref 11.7–15.4)
WBC: 10.6 10*3/uL (ref 3.4–10.8)

## 2019-09-04 LAB — GLUCOSE TOLERANCE, 2 HOURS W/ 1HR
Glucose, 1 hour: 103 mg/dL (ref 65–179)
Glucose, 2 hour: 112 mg/dL (ref 65–152)
Glucose, Fasting: 77 mg/dL (ref 65–91)

## 2019-09-04 LAB — HIV ANTIBODY (ROUTINE TESTING W REFLEX): HIV Screen 4th Generation wRfx: NONREACTIVE

## 2019-09-04 LAB — RPR: RPR Ser Ql: NONREACTIVE

## 2019-09-15 ENCOUNTER — Other Ambulatory Visit: Payer: Self-pay | Admitting: Advanced Practice Midwife

## 2019-09-15 DIAGNOSIS — B370 Candidal stomatitis: Secondary | ICD-10-CM

## 2019-09-15 MED ORDER — FLUCONAZOLE 150 MG PO TABS
150.0000 mg | ORAL_TABLET | Freq: Once | ORAL | 3 refills | Status: AC
Start: 2019-09-15 — End: 2019-09-15

## 2019-09-15 NOTE — Progress Notes (Signed)
Has white patches on tongue Vaginal yeast infection persists Rx refill diflucan

## 2019-09-18 ENCOUNTER — Ambulatory Visit (INDEPENDENT_AMBULATORY_CARE_PROVIDER_SITE_OTHER): Payer: Medicaid Other | Admitting: Family Medicine

## 2019-09-18 ENCOUNTER — Other Ambulatory Visit: Payer: Self-pay

## 2019-09-18 VITALS — BP 102/63 | HR 70 | Wt 236.0 lb

## 2019-09-18 DIAGNOSIS — O34219 Maternal care for unspecified type scar from previous cesarean delivery: Secondary | ICD-10-CM

## 2019-09-18 DIAGNOSIS — Z3480 Encounter for supervision of other normal pregnancy, unspecified trimester: Secondary | ICD-10-CM

## 2019-09-18 DIAGNOSIS — F439 Reaction to severe stress, unspecified: Secondary | ICD-10-CM

## 2019-09-18 DIAGNOSIS — Z3A32 32 weeks gestation of pregnancy: Secondary | ICD-10-CM

## 2019-09-18 MED ORDER — CITALOPRAM HYDROBROMIDE 10 MG PO TABS: 5.0000 mg | ORAL_TABLET | Freq: Every day | ORAL | 3 refills | Status: DC

## 2019-09-18 NOTE — Progress Notes (Signed)
   PRENATAL VISIT NOTE  Subjective:  Lisa Wright is a 34 y.o. [id number] at [redacted]w[redacted]d being seen today for ongoing prenatal care.  She is currently monitored for the following issues for this high-risk pregnancy and has Abnormal uterine bleeding (AUB); Obesity; Hidradenitis suppurativa; Supervision of other normal pregnancy, antepartum; and Previous cesarean delivery affecting pregnancy, antepartum on their problem list.  Patient reports celexa made her very drowsy. Would like to reduce to 5mg . Tongue is sensitive.  Contractions: Irregular. Vag. Bleeding: None.  Movement: Present. Denies leaking of fluid.   The following portions of the patient's history were reviewed and updated as appropriate: allergies, current medications, past family history, past medical history, past social history, past surgical history and problem list.   Objective:   Vitals:   09/18/19 1008  BP: 102/63  Pulse: 70  Weight: 236 lb (107 kg)    Fetal Status: Fetal Heart Rate (bpm): 142   Movement: Present     General:  Alert, oriented and cooperative. Patient is in no acute distress.  HEENT: No evidence of thrush  Skin: Skin is warm and dry. No rash noted.   Cardiovascular: Normal heart rate noted  Respiratory: Normal respiratory effort, no problems with respiration noted  Abdomen: Soft, gravid, appropriate for gestational age.  Pain/Pressure: Present     Pelvic: Cervical exam deferred        Extremities: Normal range of motion.  Edema: Trace  Mental Status: Normal mood and affect. Normal behavior. Normal judgment and thought content.   Assessment and Plan:  Pregnancy: G5P3013 at [redacted]w[redacted]d 1. [redacted] weeks gestation of pregnancy  2. Supervision of other normal pregnancy, antepartum FHT and FH normal  3. Previous cesarean delivery affecting pregnancy, antepartum Scheduled for RLTCS  4. Stress at home Reduce to celexa 5mg . Take at night.  Preterm labor symptoms and general obstetric precautions including but not  limited to vaginal bleeding, contractions, leaking of fluid and fetal movement were reviewed in detail with the patient. Please refer to After Visit Summary for other counseling recommendations.   Return in about 2 weeks (around 10/02/2019) for OB f/u.  Future Appointments  Date Time Provider [place]  09/23/2019 11:00 AM [facility] NURSE [facility]  09/23/2019 11:15 AM [facility] US2 [facility] [place], DO

## 2019-09-23 ENCOUNTER — Other Ambulatory Visit: Payer: Self-pay | Admitting: Obstetrics

## 2019-09-23 ENCOUNTER — Ambulatory Visit: Payer: Medicaid Other | Attending: Obstetrics and Gynecology

## 2019-09-23 ENCOUNTER — Other Ambulatory Visit: Payer: Self-pay

## 2019-09-23 ENCOUNTER — Other Ambulatory Visit: Payer: Self-pay | Admitting: *Deleted

## 2019-09-23 ENCOUNTER — Ambulatory Visit: Payer: Medicaid Other | Admitting: *Deleted

## 2019-09-23 DIAGNOSIS — Z3A33 33 weeks gestation of pregnancy: Secondary | ICD-10-CM

## 2019-09-23 DIAGNOSIS — O34219 Maternal care for unspecified type scar from previous cesarean delivery: Secondary | ICD-10-CM

## 2019-09-23 DIAGNOSIS — Z362 Encounter for other antenatal screening follow-up: Secondary | ICD-10-CM | POA: Diagnosis not present

## 2019-09-23 DIAGNOSIS — Z3480 Encounter for supervision of other normal pregnancy, unspecified trimester: Secondary | ICD-10-CM | POA: Diagnosis present

## 2019-09-23 DIAGNOSIS — O403XX0 Polyhydramnios, third trimester, not applicable or unspecified: Secondary | ICD-10-CM

## 2019-09-23 DIAGNOSIS — O409XX0 Polyhydramnios, unspecified trimester, not applicable or unspecified: Secondary | ICD-10-CM

## 2019-10-02 ENCOUNTER — Encounter: Payer: Medicaid Other | Admitting: Family Medicine

## 2019-10-02 ENCOUNTER — Other Ambulatory Visit: Payer: Self-pay

## 2019-10-02 ENCOUNTER — Ambulatory Visit (INDEPENDENT_AMBULATORY_CARE_PROVIDER_SITE_OTHER): Payer: Medicaid Other | Admitting: Family Medicine

## 2019-10-02 VITALS — BP 100/58 | HR 72 | Wt 237.0 lb

## 2019-10-02 DIAGNOSIS — E66812 Obesity, class 2: Secondary | ICD-10-CM

## 2019-10-02 DIAGNOSIS — Z3A34 34 weeks gestation of pregnancy: Secondary | ICD-10-CM

## 2019-10-02 DIAGNOSIS — F439 Reaction to severe stress, unspecified: Secondary | ICD-10-CM

## 2019-10-02 DIAGNOSIS — O34219 Maternal care for unspecified type scar from previous cesarean delivery: Secondary | ICD-10-CM

## 2019-10-02 DIAGNOSIS — Z3480 Encounter for supervision of other normal pregnancy, unspecified trimester: Secondary | ICD-10-CM

## 2019-10-02 DIAGNOSIS — O403XX0 Polyhydramnios, third trimester, not applicable or unspecified: Secondary | ICD-10-CM

## 2019-10-02 NOTE — Progress Notes (Signed)
   PRENATAL VISIT NOTE  Subjective:  Lisa Wright is a 34 y.o. [id number] at [redacted]w[redacted]d being seen today for ongoing prenatal care.  She is currently monitored for the following issues for this high-risk pregnancy and has Abnormal uterine bleeding (AUB); Obesity; Hidradenitis suppurativa; Supervision of other normal pregnancy, antepartum; Previous cesarean delivery affecting pregnancy, antepartum; and Polyhydramnios in third trimester on their problem list.  Patient reports no complaints.  Contractions: Not present. Vag. Bleeding: None.  Movement: Present. Denies leaking of fluid.   The following portions of the patient's history were reviewed and updated as appropriate: allergies, current medications, past family history, past medical history, past social history, past surgical history and problem list.   Objective:   Vitals:   10/02/19 1122  BP: (!) 100/58  Pulse: 72  Weight: 237 lb (107.5 kg)    Fetal Status: Fetal Heart Rate (bpm): 137 Fundal Height: 35 cm Movement: Present     General:  Alert, oriented and cooperative. Patient is in no acute distress.  Skin: Skin is warm and dry. No rash noted.   Cardiovascular: Normal heart rate noted  Respiratory: Normal respiratory effort, no problems with respiration noted  Abdomen: Soft, gravid, appropriate for gestational age.  Pain/Pressure: Present     Pelvic: Cervical exam deferred        Extremities: Normal range of motion.  Edema: Trace  Mental Status: Normal mood and affect. Normal behavior. Normal judgment and thought content.   Assessment and Plan:  Pregnancy: G5P3013 at [redacted]w[redacted]d 1. [redacted] weeks gestation of pregnancy  2. Supervision of other normal pregnancy, antepartum FHT normal  3. Previous cesarean delivery affecting pregnancy, antepartum Elective repeat at 39 weeks  4. Class 2 severe obesity due to excess calories with serious comorbidity in adult, unspecified BMI ([place])  5. Polyhydramnios in third trimester complication, single or  unspecified fetus Continue BPP weekly  6. Stress at home On celexa 5mg  daily. No complications  Preterm labor symptoms and general obstetric precautions including but not limited to vaginal bleeding, contractions, leaking of fluid and fetal movement were reviewed in detail with the patient. Please refer to After Visit Summary for other counseling recommendations.   No follow-ups on file.  Future Appointments  Date Time Provider [place]  10/19/2019 11:00 AM Truett Mainland, DO [facility] None  10/21/2019 12:30 PM [facility] NURSE [facility]  10/21/2019 12:45 PM [facility] US5 [facility] [place], DO

## 2019-10-19 ENCOUNTER — Other Ambulatory Visit (HOSPITAL_COMMUNITY)
Admission: RE | Admit: 2019-10-19 | Discharge: 2019-10-19 | Disposition: A | Discharge status: Home / Self Care | Payer: Medicaid Other | Source: Ambulatory Visit | Attending: Family Medicine | Admitting: Family Medicine

## 2019-10-19 ENCOUNTER — Ambulatory Visit (INDEPENDENT_AMBULATORY_CARE_PROVIDER_SITE_OTHER): Payer: Medicaid Other | Admitting: Family Medicine

## 2019-10-19 ENCOUNTER — Other Ambulatory Visit: Payer: Self-pay

## 2019-10-19 VITALS — BP 114/64 | HR 75 | Wt 239.0 lb

## 2019-10-19 DIAGNOSIS — Z3480 Encounter for supervision of other normal pregnancy, unspecified trimester: Secondary | ICD-10-CM

## 2019-10-19 DIAGNOSIS — O34219 Maternal care for unspecified type scar from previous cesarean delivery: Secondary | ICD-10-CM

## 2019-10-19 DIAGNOSIS — O403XX0 Polyhydramnios, third trimester, not applicable or unspecified: Secondary | ICD-10-CM

## 2019-10-19 DIAGNOSIS — Z3A36 36 weeks gestation of pregnancy: Secondary | ICD-10-CM

## 2019-10-19 NOTE — Progress Notes (Signed)
   PRENATAL VISIT NOTE  Subjective:  Lisa Wright is a 34 y.o. [id number] at [redacted]w[redacted]d being seen today for ongoing prenatal care.  She is currently monitored for the following issues for this high-risk pregnancy and has Abnormal uterine bleeding (AUB); Obesity; Hidradenitis suppurativa; Supervision of other normal pregnancy, antepartum; Previous cesarean delivery affecting pregnancy, antepartum; and Polyhydramnios in third trimester on their problem list.  Patient reports no complaints.  Contractions: Irritability. Vag. Bleeding: None.  Movement: Present. Denies leaking of fluid.   The following portions of the patient's history were reviewed and updated as appropriate: allergies, current medications, past family history, past medical history, past social history, past surgical history and problem list.   Objective:   Vitals:   10/19/19 1125  BP: 114/64  Pulse: 75  Weight: 239 lb (108.4 kg)    Fetal Status: Fetal Heart Rate (bpm): 145 Fundal Height: 37 cm Movement: Present  Presentation: Vertex  General:  Alert, oriented and cooperative. Patient is in no acute distress.  Skin: Skin is warm and dry. No rash noted.   Cardiovascular: Normal heart rate noted  Respiratory: Normal respiratory effort, no problems with respiration noted  Abdomen: Soft, gravid, appropriate for gestational age.  Pain/Pressure: Present     Pelvic: Cervical exam deferred Dilation: Fingertip Effacement (%): Thick Station: Ballotable  Extremities: Normal range of motion.  Edema: Trace  Mental Status: Normal mood and affect. Normal behavior. Normal judgment and thought content.   Assessment and Plan:  Pregnancy: G5P3013 at [redacted]w[redacted]d 1. [redacted] weeks gestation of pregnancy - Culture, beta strep (group b only) - GC/Chlamydia probe amp ([place])not at [facility]  2. Supervision of other normal pregnancy, antepartum FHT and FH normal - Culture, beta strep (group b only) - GC/Chlamydia probe amp ([place])not at [facility]  3.  Previous cesarean delivery affecting pregnancy, antepartum Rpt with BTL at 39 weeks  4. Polyhydramnios in third trimester complication, single or unspecified fetus Rpt [place] 9/29  Preterm labor symptoms and general obstetric precautions including but not limited to vaginal bleeding, contractions, leaking of fluid and fetal movement were reviewed in detail with the patient. Please refer to After Visit Summary for other counseling recommendations.   Return in about 1 week (around 10/26/2019) for OB f/u.  Future Appointments  Date Time Provider [place]  10/21/2019 12:30 PM [facility] NURSE [facility]  10/21/2019 12:45 PM [facility] US5 [facility]  10/29/2019 10:45 AM Nehemiah Settle, Tanna Savoy, DO [facility] None    Truett Mainland, DO

## 2019-10-20 LAB — GC/CHLAMYDIA PROBE AMP (~~LOC~~) NOT AT ARMC
Chlamydia: NEGATIVE
Comment: NEGATIVE
Comment: NORMAL
Neisseria Gonorrhea: NEGATIVE

## 2019-10-21 ENCOUNTER — Other Ambulatory Visit: Payer: Self-pay

## 2019-10-21 ENCOUNTER — Ambulatory Visit: Payer: Medicaid Other | Admitting: *Deleted

## 2019-10-21 ENCOUNTER — Ambulatory Visit: Payer: Medicaid Other | Attending: Obstetrics

## 2019-10-21 DIAGNOSIS — O34219 Maternal care for unspecified type scar from previous cesarean delivery: Secondary | ICD-10-CM | POA: Diagnosis present

## 2019-10-21 DIAGNOSIS — O403XX0 Polyhydramnios, third trimester, not applicable or unspecified: Secondary | ICD-10-CM

## 2019-10-21 DIAGNOSIS — O409XX0 Polyhydramnios, unspecified trimester, not applicable or unspecified: Secondary | ICD-10-CM | POA: Diagnosis not present

## 2019-10-21 DIAGNOSIS — Z3480 Encounter for supervision of other normal pregnancy, unspecified trimester: Secondary | ICD-10-CM | POA: Insufficient documentation

## 2019-10-21 DIAGNOSIS — Z3A37 37 weeks gestation of pregnancy: Secondary | ICD-10-CM

## 2019-10-22 LAB — CULTURE, BETA STREP (GROUP B ONLY): Strep Gp B Culture: POSITIVE — AB

## 2019-10-29 ENCOUNTER — Encounter (HOSPITAL_COMMUNITY): Payer: Self-pay

## 2019-10-29 ENCOUNTER — Encounter: Payer: Medicaid Other | Admitting: Family Medicine

## 2019-10-29 NOTE — Patient Instructions (Signed)
Dayne Dekay  10/29/2019   Your procedure is scheduled on:  11/04/2019  Arrive at 1100 at Entrance C on [address] [facility]  and [address]. You are invited to use the FREE valet parking or use the Visitor's parking deck.  Pick up the phone at the desk and dial [phone].  Call this number if you have problems the morning of surgery: [phone]  Remember:   Do not eat food:(After Midnight) Desps de medianoche.  Do not drink clear liquids: (After Midnight) Desps de medianoche.  Take these medicines the morning of surgery with A SIP OF WATER:  none   Do not wear jewelry, make-up or nail polish.  Do not wear lotions, powders, or perfumes. Do not wear deodorant.  Do not shave 48 hours prior to surgery.  Do not bring valuables to the hospital.  [facility] is not   responsible for any belongings or valuables brought to the hospital.  Contacts, dentures or bridgework may not be worn into surgery.  Leave suitcase in the car. After surgery it may be brought to your room.  For patients admitted to the hospital, checkout time is 11:00 AM the day of              discharge.      Please read over the following fact sheets that you were given:     Preparing for Surgery

## 2019-10-30 ENCOUNTER — Other Ambulatory Visit: Payer: Self-pay

## 2019-10-30 ENCOUNTER — Ambulatory Visit (INDEPENDENT_AMBULATORY_CARE_PROVIDER_SITE_OTHER): Payer: Medicaid Other | Admitting: Family Medicine

## 2019-10-30 VITALS — BP 123/76 | HR 67

## 2019-10-30 DIAGNOSIS — O34219 Maternal care for unspecified type scar from previous cesarean delivery: Secondary | ICD-10-CM

## 2019-10-30 DIAGNOSIS — Z3A38 38 weeks gestation of pregnancy: Secondary | ICD-10-CM

## 2019-10-30 DIAGNOSIS — Z3480 Encounter for supervision of other normal pregnancy, unspecified trimester: Secondary | ICD-10-CM

## 2019-10-30 DIAGNOSIS — F4321 Adjustment disorder with depressed mood: Secondary | ICD-10-CM

## 2019-10-30 NOTE — Progress Notes (Signed)
   PRENATAL VISIT NOTE  Subjective:  Lisa Wright is a 34 y.o. [id number] at [redacted]w[redacted]d being seen today for ongoing prenatal care.  She is currently monitored for the following issues for this high-risk pregnancy and has Abnormal uterine bleeding (AUB); Obesity; Hidradenitis suppurativa; Supervision of other normal pregnancy, antepartum; Previous cesarean delivery affecting pregnancy, antepartum; and Polyhydramnios in third trimester on their problem list.  Patient reports mom passed away suddenly. Remer Macho was yesterday..  Contractions: Irritability. Vag. Bleeding: None.  Movement: Present. Denies leaking of fluid.   The following portions of the patient's history were reviewed and updated as appropriate: allergies, current medications, past family history, past medical history, past social history, past surgical history and problem list.   Objective:   Vitals:   10/30/19 1143  BP: 123/76  Pulse: 67    Fetal Status:     Movement: Present     General:  Alert, oriented and cooperative. Patient is in no acute distress.  Skin: Skin is warm and dry. No rash noted.   Cardiovascular: Normal heart rate noted  Respiratory: Normal respiratory effort, no problems with respiration noted  Abdomen: Soft, gravid, appropriate for gestational age.        Pelvic: Cervical exam deferred        Extremities: Normal range of motion.  Edema: Trace  Mental Status: Normal mood and affect. Normal behavior. Normal judgment and thought content.   Assessment and Plan:  Pregnancy: G5P3013 at [redacted]w[redacted]d 1. [redacted] weeks gestation of pregnancy 2. Supervision of other normal pregnancy, antepartum FHT and FH normal  3. Previous cesarean delivery affecting pregnancy, antepartum Elective repeat at 39 weeks  4. Grief   Term labor symptoms and general obstetric precautions including but not limited to vaginal bleeding, contractions, leaking of fluid and fetal movement were reviewed in detail with the patient. Please refer to After  Visit Summary for other counseling recommendations.   No follow-ups on file.  Future Appointments  Date Time Provider [place]  11/02/2019  8:30 AM [facility] 1 [facility] None    Truett Mainland, DO

## 2019-11-02 ENCOUNTER — Other Ambulatory Visit: Payer: Self-pay

## 2019-11-02 ENCOUNTER — Other Ambulatory Visit (HOSPITAL_COMMUNITY)
Admission: RE | Admit: 2019-11-02 | Discharge: 2019-11-02 | Disposition: A | Discharge status: Home / Self Care | Payer: Medicaid Other | Source: Ambulatory Visit | Attending: Obstetrics and Gynecology | Admitting: Obstetrics and Gynecology

## 2019-11-02 DIAGNOSIS — Z01812 Encounter for preprocedural laboratory examination: Secondary | ICD-10-CM | POA: Insufficient documentation

## 2019-11-02 DIAGNOSIS — Z20822 Contact with and (suspected) exposure to covid-19: Secondary | ICD-10-CM | POA: Insufficient documentation

## 2019-11-02 LAB — CBC
HCT: 35.2 % — ABNORMAL LOW (ref 36.0–46.0)
Hemoglobin: 11.5 g/dL — ABNORMAL LOW (ref 12.0–15.0)
MCH: 30.7 pg (ref 26.0–34.0)
MCHC: 32.7 g/dL (ref 30.0–36.0)
MCV: 94.1 fL (ref 80.0–100.0)
Platelets: 214 10*3/uL (ref 150–400)
RBC: 3.74 MIL/uL — ABNORMAL LOW (ref 3.87–5.11)
RDW: 12.9 % (ref 11.5–15.5)
WBC: 11.4 10*3/uL — ABNORMAL HIGH (ref 4.0–10.5)
nRBC: 0 % (ref 0.0–0.2)

## 2019-11-02 LAB — TYPE AND SCREEN
ABO/RH(D): O POS
Antibody Screen: NEGATIVE

## 2019-11-02 LAB — SARS CORONAVIRUS 2 (TAT 6-24 HRS): SARS Coronavirus 2: NEGATIVE

## 2019-11-02 LAB — RPR: RPR Ser Ql: NONREACTIVE

## 2019-11-02 NOTE — Progress Notes (Signed)
Presents for Covid-19 testing and pre-op blood work prior to scheduled surgery.  Denies Covid symptoms, testing completed.  Pre-op instructions and body wah given.

## 2019-11-04 ENCOUNTER — Inpatient Hospital Stay (HOSPITAL_COMMUNITY): Payer: Medicaid Other | Admitting: Anesthesiology

## 2019-11-04 ENCOUNTER — Inpatient Hospital Stay (HOSPITAL_COMMUNITY)
Admission: AD | Admit: 2019-11-04 | Discharge: 2019-11-06 | DRG: 785 | Disposition: A | Discharge status: Home / Self Care | Payer: Medicaid Other | Attending: Obstetrics and Gynecology | Admitting: Obstetrics and Gynecology

## 2019-11-04 ENCOUNTER — Other Ambulatory Visit: Payer: Self-pay

## 2019-11-04 ENCOUNTER — Encounter (HOSPITAL_COMMUNITY)
Admission: AD | Disposition: A | Discharge status: Home / Self Care | Payer: Self-pay | Source: Home / Self Care | Attending: Obstetrics and Gynecology

## 2019-11-04 ENCOUNTER — Encounter (HOSPITAL_COMMUNITY): Payer: Self-pay | Admitting: Obstetrics and Gynecology

## 2019-11-04 DIAGNOSIS — O99214 Obesity complicating childbirth: Secondary | ICD-10-CM | POA: Diagnosis present

## 2019-11-04 DIAGNOSIS — O34219 Maternal care for unspecified type scar from previous cesarean delivery: Secondary | ICD-10-CM | POA: Diagnosis present

## 2019-11-04 DIAGNOSIS — O34211 Maternal care for low transverse scar from previous cesarean delivery: Principal | ICD-10-CM | POA: Diagnosis present

## 2019-11-04 DIAGNOSIS — B951 Streptococcus, group B, as the cause of diseases classified elsewhere: Secondary | ICD-10-CM | POA: Diagnosis not present

## 2019-11-04 DIAGNOSIS — Z87891 Personal history of nicotine dependence: Secondary | ICD-10-CM

## 2019-11-04 DIAGNOSIS — O403XX0 Polyhydramnios, third trimester, not applicable or unspecified: Secondary | ICD-10-CM | POA: Diagnosis present

## 2019-11-04 DIAGNOSIS — Z3480 Encounter for supervision of other normal pregnancy, unspecified trimester: Secondary | ICD-10-CM

## 2019-11-04 DIAGNOSIS — O99824 Streptococcus B carrier state complicating childbirth: Secondary | ICD-10-CM | POA: Diagnosis present

## 2019-11-04 DIAGNOSIS — Z3A39 39 weeks gestation of pregnancy: Secondary | ICD-10-CM

## 2019-11-04 DIAGNOSIS — Z302 Encounter for sterilization: Secondary | ICD-10-CM

## 2019-11-04 DIAGNOSIS — Z3490 Encounter for supervision of normal pregnancy, unspecified, unspecified trimester: Secondary | ICD-10-CM

## 2019-11-04 DIAGNOSIS — Z9851 Tubal ligation status: Secondary | ICD-10-CM

## 2019-11-04 DIAGNOSIS — E669 Obesity, unspecified: Secondary | ICD-10-CM | POA: Diagnosis present

## 2019-11-04 LAB — CREATININE, SERUM
Creatinine, Ser: 0.48 mg/dL (ref 0.44–1.00)
GFR, Estimated: >60 mL/min (ref >60)

## 2019-11-04 LAB — CBC
HCT: 34.7 % — ABNORMAL LOW (ref 36.0–46.0)
Hemoglobin: 11.2 g/dL — ABNORMAL LOW (ref 12.0–15.0)
MCH: 30.2 pg (ref 26.0–34.0)
MCHC: 32.3 g/dL (ref 30.0–36.0)
MCV: 93.5 fL (ref 80.0–100.0)
Platelets: 196 10*3/uL (ref 150–400)
RBC: 3.71 MIL/uL — ABNORMAL LOW (ref 3.87–5.11)
RDW: 12.8 % (ref 11.5–15.5)
WBC: 14.5 10*3/uL — ABNORMAL HIGH (ref 4.0–10.5)
nRBC: 0 % (ref 0.0–0.2)

## 2019-11-04 SURGERY — Surgical Case
Anesthesia: Spinal | Wound class: Clean Contaminated

## 2019-11-04 MED ORDER — SIMETHICONE 80 MG PO CHEW
80.0000 mg | CHEWABLE_TABLET | Freq: Three times a day (TID) | ORAL | Status: DC
  Administered 2019-11-04 – 2019-11-06 (×5): 80 mg via ORAL
  Filled 2019-11-04 (×6): qty 1

## 2019-11-04 MED ORDER — ENOXAPARIN SODIUM 60 MG/0.6ML ~~LOC~~ SOLN
50.0000 mg | SUBCUTANEOUS | Status: DC
  Administered 2019-11-05 – 2019-11-06 (×2): 50 mg via SUBCUTANEOUS
  Filled 2019-11-04 (×2): qty 0.6

## 2019-11-04 MED ORDER — MENTHOL 3 MG MT LOZG: 1.0000 | LOZENGE | OROMUCOSAL | Status: DC | PRN

## 2019-11-04 MED ORDER — ONDANSETRON HCL 4 MG/2ML IJ SOLN
INTRAMUSCULAR | Status: AC
  Filled 2019-11-04: qty 2

## 2019-11-04 MED ORDER — BUPIVACAINE IN DEXTROSE 0.75-8.25 % IT SOLN
INTRATHECAL | Status: DC | PRN
  Administered 2019-11-04: 1.6 mL via INTRATHECAL

## 2019-11-04 MED ORDER — PROMETHAZINE HCL 25 MG/ML IJ SOLN: 6.2500 mg | INTRAMUSCULAR | Status: DC | PRN

## 2019-11-04 MED ORDER — IBUPROFEN 800 MG PO TABS: 800.0000 mg | ORAL_TABLET | Freq: Three times a day (TID) | ORAL | Status: DC

## 2019-11-04 MED ORDER — MEPERIDINE HCL 25 MG/ML IJ SOLN: 6.2500 mg | INTRAMUSCULAR | Status: DC | PRN

## 2019-11-04 MED ORDER — KETOROLAC TROMETHAMINE 30 MG/ML IJ SOLN
INTRAMUSCULAR | Status: DC | PRN
  Administered 2019-11-04: 30 mg via INTRAVENOUS

## 2019-11-04 MED ORDER — FENTANYL CITRATE (PF) 100 MCG/2ML IJ SOLN
INTRAMUSCULAR | Status: AC
Start: 2019-11-04 — End: ?
  Filled 2019-11-04: qty 2

## 2019-11-04 MED ORDER — STERILE WATER FOR IRRIGATION IR SOLN
Status: DC | PRN
  Administered 2019-11-04: 1000 mL

## 2019-11-04 MED ORDER — HYDROCODONE-ACETAMINOPHEN 5-325 MG PO TABS
1.0000 | ORAL_TABLET | ORAL | Status: DC | PRN
  Administered 2019-11-05 (×2): 1 via ORAL
  Administered 2019-11-05 – 2019-11-06 (×5): 2 via ORAL
  Filled 2019-11-04: qty 2
  Filled 2019-11-04: qty 1
  Filled 2019-11-04 (×5): qty 2
  Filled 2019-11-04: qty 1

## 2019-11-04 MED ORDER — DIPHENHYDRAMINE HCL 25 MG PO CAPS: 25.0000 mg | ORAL_CAPSULE | Freq: Four times a day (QID) | ORAL | Status: DC | PRN

## 2019-11-04 MED ORDER — SOD CITRATE-CITRIC ACID 500-334 MG/5ML PO SOLN: 30.0000 mL | Freq: Once | ORAL | Status: DC

## 2019-11-04 MED ORDER — SODIUM CHLORIDE 0.9% FLUSH: 3.0000 mL | INTRAVENOUS | Status: DC | PRN

## 2019-11-04 MED ORDER — NALBUPHINE HCL 10 MG/ML IJ SOLN: 5.0000 mg | INTRAMUSCULAR | Status: DC | PRN

## 2019-11-04 MED ORDER — NALOXONE HCL 4 MG/10ML IJ SOLN
1.0000 ug/kg/h | INTRAVENOUS | Status: DC | PRN
  Filled 2019-11-04: qty 5

## 2019-11-04 MED ORDER — TETANUS-DIPHTH-ACELL PERTUSSIS 5-2.5-18.5 LF-MCG/0.5 IM SUSP: 0.5000 mL | Freq: Once | INTRAMUSCULAR | Status: DC

## 2019-11-04 MED ORDER — LACTATED RINGERS IV SOLN: INTRAVENOUS | Status: DC

## 2019-11-04 MED ORDER — SCOPOLAMINE 1 MG/3DAYS TD PT72: 1.0000 | MEDICATED_PATCH | Freq: Once | TRANSDERMAL | Status: DC

## 2019-11-04 MED ORDER — PHENYLEPHRINE HCL-NACL 10-0.9 MG/250ML-% IV SOLN
INTRAVENOUS | Status: DC | PRN
  Administered 2019-11-04: 60 ug/min via INTRAVENOUS

## 2019-11-04 MED ORDER — MORPHINE SULFATE (PF) 0.5 MG/ML IJ SOLN
INTRAMUSCULAR | Status: AC
  Filled 2019-11-04: qty 10

## 2019-11-04 MED ORDER — SCOPOLAMINE 1 MG/3DAYS TD PT72
MEDICATED_PATCH | TRANSDERMAL | Status: AC
  Filled 2019-11-04: qty 1

## 2019-11-04 MED ORDER — OXYTOCIN-SODIUM CHLORIDE 30-0.9 UT/500ML-% IV SOLN: 2.5000 [IU]/h | INTRAVENOUS | Status: AC

## 2019-11-04 MED ORDER — FAMOTIDINE 20 MG PO TABS: 20.0000 mg | ORAL_TABLET | Freq: Once | ORAL | Status: DC

## 2019-11-04 MED ORDER — DIPHENHYDRAMINE HCL 25 MG PO CAPS: 25.0000 mg | ORAL_CAPSULE | ORAL | Status: DC | PRN

## 2019-11-04 MED ORDER — KETOROLAC TROMETHAMINE 30 MG/ML IJ SOLN
INTRAMUSCULAR | Status: AC
  Filled 2019-11-04: qty 1

## 2019-11-04 MED ORDER — SENNOSIDES-DOCUSATE SODIUM 8.6-50 MG PO TABS
2.0000 | ORAL_TABLET | ORAL | Status: DC
  Administered 2019-11-04 – 2019-11-05 (×2): 2 via ORAL
  Filled 2019-11-04: qty 2

## 2019-11-04 MED ORDER — DIPHENHYDRAMINE HCL 50 MG/ML IJ SOLN
12.5000 mg | INTRAMUSCULAR | Status: DC | PRN
  Administered 2019-11-04: 12.5 mg via INTRAVENOUS

## 2019-11-04 MED ORDER — NALBUPHINE HCL 10 MG/ML IJ SOLN
5.0000 mg | INTRAMUSCULAR | Status: DC | PRN
  Administered 2019-11-04: 5 mg via SUBCUTANEOUS
  Filled 2019-11-04: qty 1

## 2019-11-04 MED ORDER — NALBUPHINE HCL 10 MG/ML IJ SOLN: 5.0000 mg | Freq: Once | INTRAMUSCULAR | Status: DC | PRN

## 2019-11-04 MED ORDER — MORPHINE SULFATE (PF) 10 MG/ML IV SOLN
INTRAVENOUS | Status: DC | PRN
Start: 2019-11-04 — End: 2019-11-04
  Administered 2019-11-04: .15 mg via INTRATHECAL

## 2019-11-04 MED ORDER — CEFAZOLIN SODIUM-DEXTROSE 2-4 GM/100ML-% IV SOLN
INTRAVENOUS | Status: AC
  Filled 2019-11-04: qty 100

## 2019-11-04 MED ORDER — LACTATED RINGERS IV SOLN: INTRAVENOUS | Status: DC | PRN

## 2019-11-04 MED ORDER — COCONUT OIL OIL: 1.0000 "application " | TOPICAL_OIL | Status: DC | PRN

## 2019-11-04 MED ORDER — FENTANYL CITRATE (PF) 100 MCG/2ML IJ SOLN
INTRAMUSCULAR | Status: DC | PRN
Start: 2019-11-04 — End: 2019-11-04
  Administered 2019-11-04: 15 ug via INTRATHECAL

## 2019-11-04 MED ORDER — ACETAMINOPHEN 500 MG PO TABS
1000.0000 mg | ORAL_TABLET | Freq: Four times a day (QID) | ORAL | Status: AC
  Administered 2019-11-04 – 2019-11-05 (×3): 1000 mg via ORAL
  Filled 2019-11-04 (×3): qty 2

## 2019-11-04 MED ORDER — PHENYLEPHRINE HCL-NACL 20-0.9 MG/250ML-% IV SOLN
INTRAVENOUS | Status: AC
  Filled 2019-11-04: qty 250

## 2019-11-04 MED ORDER — SIMETHICONE 80 MG PO CHEW
80.0000 mg | CHEWABLE_TABLET | ORAL | Status: DC
  Administered 2019-11-04 – 2019-11-05 (×2): 80 mg via ORAL
  Filled 2019-11-04 (×2): qty 1

## 2019-11-04 MED ORDER — ORAL CARE MOUTH RINSE: 15.0000 mL | Freq: Once | OROMUCOSAL | Status: DC

## 2019-11-04 MED ORDER — SOD CITRATE-CITRIC ACID 500-334 MG/5ML PO SOLN
ORAL | Status: AC
  Filled 2019-11-04: qty 30

## 2019-11-04 MED ORDER — NALOXONE HCL 0.4 MG/ML IJ SOLN: 0.4000 mg | INTRAMUSCULAR | Status: DC | PRN

## 2019-11-04 MED ORDER — ONDANSETRON HCL 4 MG/2ML IJ SOLN: 4.0000 mg | Freq: Three times a day (TID) | INTRAMUSCULAR | Status: DC | PRN

## 2019-11-04 MED ORDER — SCOPOLAMINE 1 MG/3DAYS TD PT72
1.0000 | MEDICATED_PATCH | Freq: Once | TRANSDERMAL | Status: DC
  Administered 2019-11-04: 1.5 mg via TRANSDERMAL

## 2019-11-04 MED ORDER — WITCH HAZEL-GLYCERIN EX PADS: 1.0000 "application " | MEDICATED_PAD | CUTANEOUS | Status: DC | PRN

## 2019-11-04 MED ORDER — CHLORHEXIDINE GLUCONATE 0.12 % MT SOLN: 15.0000 mL | Freq: Once | OROMUCOSAL | Status: DC

## 2019-11-04 MED ORDER — SIMETHICONE 80 MG PO CHEW: 80.0000 mg | CHEWABLE_TABLET | ORAL | Status: DC | PRN

## 2019-11-04 MED ORDER — DIBUCAINE (PERIANAL) 1 % EX OINT: 1.0000 "application " | TOPICAL_OINTMENT | CUTANEOUS | Status: DC | PRN

## 2019-11-04 MED ORDER — SOD CITRATE-CITRIC ACID 500-334 MG/5ML PO SOLN
30.0000 mL | ORAL | Status: AC
  Administered 2019-11-04: 30 mL via ORAL

## 2019-11-04 MED ORDER — ONDANSETRON HCL 4 MG/2ML IJ SOLN
INTRAMUSCULAR | Status: DC | PRN
  Administered 2019-11-04: 4 mg via INTRAVENOUS

## 2019-11-04 MED ORDER — SODIUM CHLORIDE 0.9 % IR SOLN
Status: DC | PRN
  Administered 2019-11-04: 1000 mL

## 2019-11-04 MED ORDER — OXYTOCIN-SODIUM CHLORIDE 30-0.9 UT/500ML-% IV SOLN
INTRAVENOUS | Status: AC
  Filled 2019-11-04: qty 500

## 2019-11-04 MED ORDER — DIPHENHYDRAMINE HCL 50 MG/ML IJ SOLN
INTRAMUSCULAR | Status: AC
  Filled 2019-11-04: qty 1

## 2019-11-04 MED ORDER — OXYTOCIN-SODIUM CHLORIDE 30-0.9 UT/500ML-% IV SOLN
INTRAVENOUS | Status: DC | PRN
  Administered 2019-11-04: 30 [IU] via INTRAVENOUS

## 2019-11-04 MED ORDER — FENTANYL CITRATE (PF) 100 MCG/2ML IJ SOLN: 25.0000 ug | INTRAMUSCULAR | Status: DC | PRN

## 2019-11-04 MED ORDER — CEFAZOLIN SODIUM-DEXTROSE 2-4 GM/100ML-% IV SOLN
2.0000 g | INTRAVENOUS | Status: AC
  Administered 2019-11-04: 2 g via INTRAVENOUS

## 2019-11-04 MED ORDER — PRENATAL MULTIVITAMIN CH
1.0000 | ORAL_TABLET | Freq: Every day | ORAL | Status: DC
  Administered 2019-11-05: 1 via ORAL
  Filled 2019-11-04 (×2): qty 1

## 2019-11-04 MED ORDER — GABAPENTIN 100 MG PO CAPS
100.0000 mg | ORAL_CAPSULE | Freq: Three times a day (TID) | ORAL | Status: DC
  Administered 2019-11-04 – 2019-11-06 (×4): 100 mg via ORAL
  Filled 2019-11-04 (×5): qty 1

## 2019-11-04 SURGICAL SUPPLY — 33 items
BENZOIN TINCTURE PRP APPL 2/3 (GAUZE/BANDAGES/DRESSINGS) ×3 IMPLANT
CHLORAPREP W/TINT 26ML (MISCELLANEOUS) ×3 IMPLANT
CLAMP CORD UMBIL (MISCELLANEOUS) IMPLANT
CLOSURE STERI STRIP 1/2 X4 (GAUZE/BANDAGES/DRESSINGS) ×3 IMPLANT
DRSG OPSITE POSTOP 4X10 (GAUZE/BANDAGES/DRESSINGS) ×3 IMPLANT
ELECT REM PT RETURN 9FT ADLT (ELECTROSURGICAL) ×3
ELECTRODE REM PT RTRN 9FT ADLT (ELECTROSURGICAL) ×1 IMPLANT
EXTRACTOR VACUUM M CUP 4 TUBE (SUCTIONS) IMPLANT
EXTRACTOR VACUUM M CUP 4' TUBE (SUCTIONS)
GAUZE SPONGE 4X4 12PLY STRL LF (GAUZE/BANDAGES/DRESSINGS) ×6 IMPLANT
GLOVE BIOGEL PI IND STRL 6.5 (GLOVE) ×1 IMPLANT
GLOVE BIOGEL PI IND STRL 7.0 (GLOVE) ×1 IMPLANT
GLOVE BIOGEL PI INDICATOR 6.5 (GLOVE) ×2
GLOVE BIOGEL PI INDICATOR 7.0 (GLOVE) ×2
GLOVE SURG SS PI 6.5 STRL IVOR (GLOVE) ×3 IMPLANT
GOWN STRL REUS W/TWL LRG LVL3 (GOWN DISPOSABLE) ×6 IMPLANT
KIT ABG SYR 3ML LUER SLIP (SYRINGE) IMPLANT
NEEDLE HYPO 25X5/8 SAFETYGLIDE (NEEDLE) IMPLANT
NS IRRIG 1000ML POUR BTL (IV SOLUTION) ×3 IMPLANT
PACK C SECTION WH (CUSTOM PROCEDURE TRAY) ×3 IMPLANT
PAD ABD 7.5X8 STRL (GAUZE/BANDAGES/DRESSINGS) ×3 IMPLANT
PAD OB MATERNITY 4.3X12.25 (PERSONAL CARE ITEMS) ×3 IMPLANT
PENCIL SMOKE EVAC W/HOLSTER (ELECTROSURGICAL) ×3 IMPLANT
RTRCTR C-SECT PINK 25CM LRG (MISCELLANEOUS) IMPLANT
SUT PLAIN 0 NONE (SUTURE) IMPLANT
SUT PLAIN 2 0 XLH (SUTURE) ×3 IMPLANT
SUT VIC AB 0 CT1 36 (SUTURE) ×12 IMPLANT
SUT VIC AB 2-0 SH 27 (SUTURE) ×4
SUT VIC AB 2-0 SH 27XBRD (SUTURE) ×2 IMPLANT
SUT VIC AB 4-0 KS 27 (SUTURE) ×3 IMPLANT
TOWEL OR 17X24 6PK STRL BLUE (TOWEL DISPOSABLE) ×3 IMPLANT
TRAY FOLEY W/BAG SLVR 14FR LF (SET/KITS/TRAYS/PACK) ×3 IMPLANT
WATER STERILE IRR 1000ML POUR (IV SOLUTION) ×3 IMPLANT

## 2019-11-04 NOTE — Discharge Instructions (Signed)

## 2019-11-04 NOTE — Anesthesia Procedure Notes (Signed)
Epidural Patient location during procedure: OR Start time: 11/04/2019 1:06 PM End time: 11/04/2019 1:14 PM  Staffing Anesthesiologist: [name], MD Performed: anesthesiologist   Preanesthetic Checklist Completed: patient identified, IV checked, site marked, risks and benefits discussed, surgical consent, monitors and equipment checked, pre-op evaluation and timeout performed  Epidural Patient position: sitting Prep: DuraPrep Patient monitoring: heart rate, continuous pulse ox and blood pressure Approach: midline Location: L3-L4 Injection technique: LOR air  Needle:  Needle type: Tuohy  Needle gauge: 17 G Needle length: 9 cm Needle insertion depth: 7 cm Catheter type: closed end flexible Catheter size: 19 Gauge Catheter at skin depth: 12 cm  Assessment Sensory level: T4  Additional Notes Combined spinal/epidural procedure. Epidural catheter left in place for possible use intra-operatively if needed.Reason for block:surgical anesthesia

## 2019-11-04 NOTE — Anesthesia Postprocedure Evaluation (Signed)
Anesthesia Post Note  Patient: Lisa Wright  Procedure(s) Performed: CESAREAN SECTION (N/A )     Patient location during evaluation: PACU Anesthesia Type: Combined General/Spinal Level of consciousness: oriented and awake and alert Pain management: pain level controlled Vital Signs Assessment: post-procedure vital signs reviewed and stable Respiratory status: spontaneous breathing, respiratory function stable and nonlabored ventilation Cardiovascular status: blood pressure returned to baseline and stable Postop Assessment: no headache, no backache, no apparent nausea or vomiting, patient able to bend at knees and spinal receding Anesthetic complications: no   No complications documented.  Last Vitals:  Vitals:   11/04/19 1600 11/04/19 1715  BP: 114/83 112/63  Pulse: (!) 59 (!) 56  Resp: 18 16  Temp: (!) 36.4 C 36.6 C  SpO2: 100% 100%    Last Pain:  Vitals:   11/04/19 1715  TempSrc: Oral   Pain Goal:                   [name]

## 2019-11-04 NOTE — H&P (Signed)
OBSTETRIC ADMISSION HISTORY AND PHYSICAL  Lisa Wright is a 34 y.o. female [phone] with IUP at 76w0dby early ultrasound presenting for scheduled rLTCS (prior cesarean section x2). She reports +FMs, No LOF, no VB, no blurry vision, headaches or peripheral edema, and RUQ pain.  She plans on breast and bottle feeding. She request BTL for birth control, papers signed 10/19/19. [facility]  Dating: By early ultrasound --->  Estimated Date of Delivery: 11/11/19  Sono:    10/21/19@[redacted]w[redacted]d , CWD, normal anatomy, cephalic presentation, 23500X 11% EFW   Prenatal History/Complications:  Prior cesarean section (x2) GBS positive Polyhydramnios (resolved) BMI 38  Past Medical History: Past Medical History:  Diagnosis Date  . Chlamydia   . Migraines   . Trichomonas infection   . Vaginal Pap smear, abnormal     Past Surgical History: Past Surgical History:  Procedure Laterality Date  . CESAREAN SECTION    . CESAREAN SECTION    . COLPOSCOPY      Obstetrical History: OB History    Gravida  5   Para  3   Term  3   Preterm      AB  1   Living  3     SAB      TAB  1   Ectopic      Multiple      Live Births  3           Social History Social History   Socioeconomic History  . Marital status: Single    Spouse name: Not on file  . Number of children: Not on file  . Years of education: Not on file  . Highest education level: Not on file  Occupational History  . Not on file  Tobacco Use  . Smoking status: Former Smoker    Packs/day: 0.30    Years: 14.00    Pack years: 4.20    Types: Cigarettes    Quit date: 10/29/2019    Years since quitting: 0.0  . Smokeless tobacco: Never Used  . Tobacco comment: 1 pack every 3 days  Vaping Use  . Vaping Use: Never used  Substance and Sexual Activity  . Alcohol use: No    Alcohol/week: 0.0 standard drinks  . Drug use: No  . Sexual activity: Yes    Partners: Male    Birth control/protection:  None  Other Topics Concern  . Not on file  Social History Narrative  . Not on file   Social Determinants of Health   Financial Resource Strain:   . Difficulty of Paying Living Expenses: Not on file  Food Insecurity:   . Worried About RCharity fundraiserin the Last Year: Not on file  . Ran Out of Food in the Last Year: Not on file  Transportation Needs:   . Lack of Transportation (Medical): Not on file  . Lack of Transportation (Non-Medical): Not on file  Physical Activity:   . Days of Exercise per Week: Not on file  . Minutes of Exercise per Session: Not on file  Stress:   . Feeling of Stress : Not on file  Social Connections:   . Frequency of Communication with Friends and Family: Not on file  . Frequency of Social Gatherings with Friends and Family: Not on file  . Attends Religious Services: Not on file  . Active Member of Clubs or Organizations: Not on file  . Attends CArchivistMeetings: Not on file  .  Marital Status: Not on file    Family History: Family History  Problem Relation Age of Onset  . Diabetes Father   . Hypertension Father   . Dementia Maternal Grandmother   . Diabetes Maternal Grandfather   . Diabetes Paternal Grandmother   . Stroke Mother   . Lung cancer Mother     Allergies: Allergies  Allergen Reactions  . Ibuprofen Nausea And Vomiting  . Citalopram     Felt like a zombie   . Percocet [Oxycodone-Acetaminophen] Itching    Medications Prior to Admission  Medication Sig Dispense Refill Last Dose  . acetaminophen (TYLENOL) 500 MG tablet Take 1,000 mg by mouth every 6 (six) hours as needed for moderate pain or headache.     . pantoprazole (PROTONIX) 40 MG tablet Take 1 tablet (40 mg total) by mouth daily. 30 tablet 3   . Prenatal Vit-Fe Fumarate-FA (PREPLUS) 27-1 MG TABS Take 1 tablet by mouth daily. 30 tablet 13   . AMBULATORY NON FORMULARY MEDICATION 1 Device by Other route once a week. Blood pressure cuff/ Large  Monitored  Regularly at home  ICD 10 Z34.90 LROB 1 kit 0   . citalopram (CELEXA) 10 MG tablet Take 0.5 tablets (5 mg total) by mouth daily. (Patient not taking: Reported on 10/26/2019) 15 tablet 3 Not Taking at Unknown time     Review of Systems   All systems reviewed and negative except as stated in HPI  Last menstrual period 01/29/2019. General appearance: alert, cooperative and no distress Lungs: normal respiratory effort Heart: regular rate and rhythm Abdomen: soft, non-tender; gravid Pelvic: as noted below Extremities: Homans sign is negative, no sign of DVT  Prenatal labs: ABO, Rh: --/--/O POS (10/11 0912) Antibody: NEG (10/11 0912) Rubella: 1.23 (03/30 1029) RPR: NON REACTIVE (10/11 0912)  HBsAg: Negative (03/30 1029)  HIV: Non Reactive (08/12 0946)  GBS: Positive/-- (09/27 1200)  2 hr Glucola passed Genetic screening  Low risk Anatomy US normal  Prenatal Transfer Tool  Maternal Diabetes: No Genetic Screening: Normal Maternal Ultrasounds/Referrals: Normal Fetal Ultrasounds or other Referrals:  None Maternal Substance Abuse:  Yes:  Type: Smoker Significant Maternal Medications:  None Significant Maternal Lab Results: Group B Strep positive  No results found for this or any previous visit (from the past 24 hour(s)).  Patient Active Problem List   Diagnosis Date Noted  . Pregnancy 11/04/2019  . Polyhydramnios in third trimester 10/02/2019  . Previous cesarean delivery affecting pregnancy, antepartum 06/18/2019  . Supervision of other normal pregnancy, antepartum 04/21/2019  . Obesity 07/29/2014  . Hidradenitis suppurativa 07/29/2014    Assessment/Plan:  Lisa Wright is a 34 y.o. [id number] at 13w0dhere for scheduled elective rLTCS, history of cesarean section x2.  #Cesarean section/BTL The risks of cesarean section were discussed with the patient including but were not limited to: bleeding which may require transfusion or reoperation; infection which may require  antibiotics; injury to bowel, bladder, ureters or other surrounding organs; injury to the fetus; need for additional procedures including hysterectomy in the event of a life-threatening hemorrhage; placental abnormalities wth subsequent pregnancies, incisional problems, thromboembolic phenomenon and other postoperative/anesthesia complications.  Patient also desires permanent sterilization.  Other reversible forms of contraception were discussed with patient; she declines all other modalities. Risks of procedure discussed with patient including but not limited to: risk of regret, permanence of method, bleeding, infection, injury to surrounding organs and need for additional procedures.  Failure risk of about 1% with increased risk of ectopic gestation if  pregnancy occurs was also discussed with patient.  Also discussed possibility of post-tubal pain syndrome. The patient concurred with the proposed plan, giving informed written consent for the procedures.  Patient has been NPO since 0000 she will remain NPO for procedure. Anesthesia and OR aware.  Preoperative prophylactic antibiotics and SCDs ordered on call to the OR.  To OR when ready.  #Pain: spinal #ID: GBS pos, intra-op ancef for surgical prophylaxis #MOF: both #MOC: BTL, papers signed 10/19/19, less than 30 days, but will perform given repeat cesarean section #Circ: yes  Arrie Senate, MD  11/04/2019, 12:22 PM

## 2019-11-04 NOTE — Discharge Summary (Signed)
Postpartum Discharge Summary  Date of Service updated 11/06/19     Patient Name: Lisa Wright DOB: Sep 10, 1985 MRN: [id number]  Date of admission: 11/04/2019 Delivery date:11/04/2019  Delivering provider: CONSTANT, PEGGY  Date of discharge: 11/06/2019  Admitting diagnosis: Pregnancy [Z34.90] Intrauterine pregnancy: [redacted]w[redacted]d    Secondary diagnosis:  Active Problems:   Obesity   Supervision of other normal pregnancy, antepartum   Previous cesarean delivery affecting pregnancy, antepartum   Polyhydramnios in third trimester   Pregnancy   Cesarean delivery delivered   History of bilateral tubal ligation  Additional problems: none    Discharge diagnosis: Term Pregnancy Delivered                                              Post partum procedures:postpartum tubal ligation Augmentation: N/A Complications: None  Hospital course: Sceduled C/S   34y.o. yo [id number] admitted to the hospital 11/04/2019 for scheduled cesarean section with the following indication:Elective Repeat.Delivery details are as follows:  Membrane Rupture Time/Date: 1:43 PM ,11/04/2019   Delivery Method:C-Section, Low Transverse  Details of operation can be found in separate operative note.  Patient had an uncomplicated postpartum course.  She is ambulating, tolerating a regular diet, passing flatus, and urinating well. Patient is discharged home in stable condition on  11/06/19        Newborn Data: Birth date:11/04/2019  Birth time:1:44 PM  Gender:Female  Living status:Living  Apgars:9 ,10  Weight:2555 g     Magnesium Sulfate received: No BMZ received: No Rhophylac:N/A MMR:N/A T-DaP:Given prenatally Flu: No Transfusion:No  Physical exam  Vitals:   11/05/19 0417 11/05/19 1542 11/05/19 2018 11/06/19 0557  BP: 107/60 (!) 104/54 116/60 (!) 101/55  Pulse: (!) 54 60 61 (!) 56  Resp: 16 17 18 16   Temp: 99.4 F (37.4 C) 98.7 F (37.1 C) 97.8 F (36.6 C) 98.1 F (36.7 C)  TempSrc: Oral Oral  Oral Oral  SpO2: 98%  97% 99%   General: alert, cooperative and no distress Lochia: appropriate Uterine Fundus: firm Incision: Dressing is clean, dry, and intact DVT Evaluation: No evidence of DVT seen on physical exam. Negative Homan's sign. No cords or calf tenderness. No significant calf/ankle edema. Labs: Lab Results  Component Value Date   WBC 9.3 11/05/2019   HGB 9.8 (L) 11/05/2019   HCT 29.5 (L) 11/05/2019   MCV 93.9 11/05/2019   PLT 185 11/05/2019   CMP Latest Ref Rng & Units 11/04/2019  Glucose 70 - 99 mg/dL -  BUN 6 - 20 mg/dL -  Creatinine 0.44 - 1.00 mg/dL 0.48  Sodium 135 - 145 mmol/L -  Potassium 3.5 - 5.1 mmol/L -  Chloride 98 - 111 mmol/L -  CO2 22 - 32 mmol/L -  Calcium 8.9 - 10.3 mg/dL -  Total Protein 6.0 - 8.3 g/dL -  Total Bilirubin 0.2 - 1.2 mg/dL -  Alkaline Phos 39 - 117 U/L -  AST 0 - 37 U/L -  ALT 0 - 35 U/L -   Edinburgh Score: Edinburgh Postnatal Depression Scale Screening Tool 11/06/2019  I have been able to laugh and see the funny side of things. 1  I have looked forward with enjoyment to things. 1  I have blamed myself unnecessarily when things went wrong. 2  I have been anxious or worried for no good reason. 2  I have felt scared or panicky for no good reason. 2  Things have been getting on top of me. 2  I have been so unhappy that I have had difficulty sleeping. 2  I have felt sad or miserable. 3  I have been so unhappy that I have been crying. 1  The thought of harming myself has occurred to me. 1  Edinburgh Postnatal Depression Scale Total 17     After visit meds:  Allergies as of 11/06/2019      Reactions   Ibuprofen Nausea And Vomiting   Citalopram    Felt like a zombie    Percocet [oxycodone-acetaminophen] Itching   Can tolerate hydrocodone      Medication List    TAKE these medications   acetaminophen 500 MG tablet Commonly known as: TYLENOL Take 1,000 mg by mouth every 6 (six) hours as needed for moderate pain  or headache.   AMBULATORY NON FORMULARY MEDICATION 1 Device by Other route once a week. Blood pressure cuff/ Large  Monitored Regularly at home  ICD 10 Z34.90 LROB   citalopram 10 MG tablet Commonly known as: CeleXA Take 0.5 tablets (5 mg total) by mouth daily.   HYDROcodone-acetaminophen 5-325 MG tablet Commonly known as: NORCO/VICODIN Take 2 tablets by mouth every 6 (six) hours as needed for moderate pain.   pantoprazole 40 MG tablet Commonly known as: Protonix Take 1 tablet (40 mg total) by mouth daily.   PrePLUS 27-1 MG Tabs Take 1 tablet by mouth daily.        Discharge home in stable condition Infant Feeding: Bottle and Breast Infant Disposition:home with mother Discharge instruction: per After Visit Summary and Postpartum booklet. Activity: Advance as tolerated. Pelvic rest for 6 weeks.  Diet: routine diet Future Appointments:No future appointments. Follow up Visit:  [place] Follow up in 4 week(s).   Specialty: Obstetrics and Gynecology Contact information: [address] [place] [id number] [phone]              Message sent to schedulers 11/04/19 by Sylvester Harder  Please schedule this patient for a In person postpartum visit in 4 weeks with the following provider: Any provider. Additional Postpartum F/U:Incision check 1 week  Low risk pregnancy complicated by: n/a Delivery mode:  C-Section, Low Transverse  Anticipated Birth Control:  BTL done [facility]   11/06/2019 Fatima Blank, CNM

## 2019-11-04 NOTE — Transfer of Care (Signed)
Immediate Anesthesia Transfer of Care Note  Patient: Lisa Wright  Procedure(s) Performed: CESAREAN SECTION (N/A )  Patient Location: PACU  Anesthesia Type:Spinal  Level of Consciousness: awake and alert   Airway & Oxygen Therapy: Patient Spontanous Breathing  Post-op Assessment: Report given to RN and Post -op Vital signs reviewed and stable  Post vital signs: Reviewed and stable  Last Vitals:  Vitals Value Taken Time  BP 87/60 11/04/19 1441  Temp    Pulse 69 11/04/19 1443  Resp 19 11/04/19 1443  SpO2 97 % 11/04/19 1443  Vitals shown include unvalidated device data.  Last Pain:  Vitals:   11/04/19 1239  TempSrc: Oral         Complications: No complications documented.

## 2019-11-04 NOTE — Anesthesia Preprocedure Evaluation (Addendum)
Anesthesia Evaluation  Patient identified by MRN, date of birth, ID band Patient awake    Reviewed: Allergy & Precautions, NPO status , Patient's Chart, lab work & pertinent test results  Airway Mallampati: II  TM Distance: >3 FB Neck ROM: Full    Dental  (+) Teeth Intact   Pulmonary former smoker,    Pulmonary exam normal breath sounds clear to auscultation       Cardiovascular negative cardio ROS Normal cardiovascular exam Rhythm:Regular Rate:Normal     Neuro/Psych  Headaches,    GI/Hepatic Neg liver ROS, GERD  Medicated,  Endo/Other  Morbid obesity  Renal/GU negative Renal ROS     Musculoskeletal negative musculoskeletal ROS (+)   Abdominal   Peds  Hematology  (+) Blood dyscrasia, anemia , Plt 214k   Anesthesia Other Findings   Reproductive/Obstetrics (+) Pregnancy Prev c/s x2                            Anesthesia Physical Anesthesia Plan  ASA: III  Anesthesia Plan: Combined Spinal and Epidural   Post-op Pain Management:    Induction:   PONV Risk Score and Plan: 2 and Treatment may vary due to age or medical condition  Airway Management Planned: Natural Airway  Additional Equipment:   Intra-op Plan:   Post-operative Plan:   Informed Consent: I have reviewed the patients History and Physical, chart, labs and discussed the procedure including the risks, benefits and alternatives for the proposed anesthesia with the patient or authorized representative who has indicated his/her understanding and acceptance.       Plan Discussed with:   Anesthesia Plan Comments:        Anesthesia Quick Evaluation

## 2019-11-04 NOTE — Op Note (Signed)
Lisa Wright PROCEDURE DATE: 11/04/2019  PREOPERATIVE DIAGNOSIS: Intrauterine pregnancy at  [redacted]w[redacted]d weeks gestation; patient declines vag del attempt and desire for permanent sterilization  POSTOPERATIVE DIAGNOSIS: The same  PROCEDURE:     Cesarean Section and Bilateral Tubal Sterilization using Pomeroy method  SURGEON:  Dr. Mora Bellman  ASSISTANT: Dr. Juleen China and Dr. Sylvester Harder  INDICATIONS: Lisa Wright is a 34 y.o. [id number] at [redacted]w[redacted]d scheduled for cesarean section secondary to patient declines vag del attempt.  The risks of cesarean section discussed with the patient included but were not limited to: bleeding which may require transfusion or reoperation; infection which may require antibiotics; injury to bowel, bladder, ureters or other surrounding organs; injury to the fetus; need for additional procedures including hysterectomy in the event of a life-threatening hemorrhage; placental abnormalities wth subsequent pregnancies, incisional problems, thromboembolic phenomenon and other postoperative/anesthesia complications. Patient also desires permanent sterilization. Risks and benefits of procedure discussed with patient including permanence of method, bleeding, infection, injury to surrounding organs and need for additional procedures. Risk failure of 0.5-1% with increased risk of ectopic gestation if pregnancy occurs was also discussed with patient. The patient concurred with the proposed plan, giving informed written consent for the procedure.    FINDINGS:  Viable female infant in cephalic presentation.  Apgars 9 and 10.  Clear amniotic fluid.  Intact placenta, three vessel cord.  Normal uterus, fallopian tubes and ovaries bilaterally.  ANESTHESIA:    Spinal INTRAVENOUS FLUIDS:2000 ml ESTIMATED BLOOD LOSS: 177 ml URINE OUTPUT:  150 ml SPECIMENS: Placenta sent to L&D and portions of right and left fallopian tubes sent to pathology COMPLICATIONS: None immediate  PROCEDURE IN DETAIL:  The patient  received intravenous antibiotics and had sequential compression devices applied to her lower extremities while in the preoperative area.  She was then taken to the operating room where anesthesia was induced and was found to be adequate. A foley catheter was placed into her bladder and attached to Lisa Wright gravity. She was then placed in a dorsal supine position with a leftward tilt, and prepped and draped in a sterile manner. After an adequate timeout was performed, a Pfannenstiel skin incision was made with scalpel and carried through to the underlying layer of fascia. The fascia was incised in the midline and this incision was extended bilaterally using the Mayo scissors. Kocher clamps were applied to the superior aspect of the fascial incision and the underlying rectus muscles were dissected off bluntly. A similar process was carried out on the inferior aspect of the facial incision. The rectus muscles were separated in the midline bluntly and the peritoneum was entered bluntly. The Alexis self-retaining retractor was introduced into the abdominal cavity. Attention was turned to the lower uterine segment where a transverse hysterotomy was made with a scalpel and extended bilaterally bluntly. The infant was successfully delivered, and cord was clamped and cut and infant was handed over to awaiting neonatology team. Uterine massage was then administered and the placenta delivered intact with three-vessel cord. The uterus was cleared of clot and debris.  The hysterotomy was closed with 0 Vicryl in a running locked fashion, and an imbricating layer was also placed with a 0 Vicryl. Overall, excellent hemostasis was noted. The pelvis copiously irrigated and cleared of all clot and debris. The patient's left fallopian tube was then identified, brought to the incision, and grasped with a Babcock clamp. The tube was then followed out to the fimbria. The Babcock clamp was then used to grasp the tube approximately 4 cm  from the cornual region. A 3 cm segment of the tube was then ligated with free tie of plain gut suture, transected and excised. Good hemostasis was noted and the tube was returned to the abdomen. The right fallopian tube was then identified to its fimbriated end, ligated, and a 3 cm segment excised in a similar fashion. Excellent hemostasis was noted, and the tube returned to the abdomen. Hemostasis was confirmed on all surfaces.  The peritoneum and the muscles were reapproximated using 0 vicryl interrupted stitches. The fascia was then closed using 0 Vicryl in a running fashion.  The subcutaneous layer was reapproximated with plain gut and the skin was closed in a subcuticular fashion using 3.0 Vicryl. The patient tolerated the procedure well. Sponge, lap, instrument and needle counts were correct x 2. She was taken to the recovery room in stable condition.    Lisa Wright ConstantMD  11/04/2019 2:26 PM

## 2019-11-05 LAB — CBC
HCT: 29.5 % — ABNORMAL LOW (ref 36.0–46.0)
Hemoglobin: 9.8 g/dL — ABNORMAL LOW (ref 12.0–15.0)
MCH: 31.2 pg (ref 26.0–34.0)
MCHC: 33.2 g/dL (ref 30.0–36.0)
MCV: 93.9 fL (ref 80.0–100.0)
Platelets: 185 10*3/uL (ref 150–400)
RBC: 3.14 MIL/uL — ABNORMAL LOW (ref 3.87–5.11)
RDW: 13.2 % (ref 11.5–15.5)
WBC: 9.3 10*3/uL (ref 4.0–10.5)
nRBC: 0 % (ref 0.0–0.2)

## 2019-11-05 LAB — BIRTH TISSUE RECOVERY COLLECTION (PLACENTA DONATION)

## 2019-11-05 LAB — SURGICAL PATHOLOGY

## 2019-11-05 NOTE — Progress Notes (Signed)
Subjective: Postpartum Day #1: Cesarean Delivery & BTL Patient reports tolerating PO and + flatus; breast and bottlefeeding; foley removed earlier but hasn't been up to void yet; denies dizziness while in bed  Objective: Vital signs in last 24 hours: Temp:  [97.5 F (36.4 C)-99.4 F (37.4 C)] 99.4 F (37.4 C) (10/14 0417) Pulse Rate:  [50-83] 54 (10/14 0417) Resp:  [9-19] 16 (10/14 0417) BP: (90-128)/(58-83) 107/60 (10/14 0417) SpO2:  [96 %-100 %] 98 % (10/14 0417)  Physical Exam:  General: alert, cooperative and no distress Lochia: appropriate Uterine Fundus: firm Incision: pressure dsg a little loose but dry DVT Evaluation: No evidence of DVT seen on physical exam.  Recent Labs    11/04/19 1719 11/05/19 0504  HGB 11.2* 9.8*  HCT 34.7* 29.5*    Assessment/Plan: Status post Cesarean section. Doing well postoperatively.  Continue current care. Anticipate d/c 11/06/19.  Myrtis Ser CNM 11/05/2019, 8:54 AM

## 2019-11-05 NOTE — Clinical Social Work Maternal (Signed)
CLINICAL SOCIAL WORK MATERNAL/CHILD NOTE  Patient Details  Name: Lisa Wright MRN: [id number] Date of Birth: 1985-01-27  Date:  11/05/2019  Clinical Social Worker Initiating Note:  Durward Fortes. LCSW Date/Time: Initiated:  11/05/19/0910     Child's Name:  Lisa Wright   Biological Parents:  Mother Latrease Kunde)   Need for Interpreter:  None   Reason for Referral:  Behavioral Health Concerns, Other (Comment) (Custody of children questions.)   Address:  [address] [place] [address]    Phone number:  [phone] (home)     Additional phone number: none   Household Members/Support Persons (HM/SP):   Household Member/Support Person 1, Household Member/Support Person 2   HM/SP Name Relationship DOB or Age  HM/SP -1  Mayara Paulson [name] June 29, 1985  HM/SP -[place].  Son 04/21/2004  HM/SP -3 Ethen Woodmansee son 03/11/2007  HM/SP -4 Ja'Lil Pearson son 06/01/2009  HM/SP -5 Armond Snedeker [name]'s dad 04/17/1946  HM/SP -6        HM/SP -7        HM/SP -8          Natural Supports (not living in the home):  Parent, Friends   Professional Supports: None   Employment: Full-time   Type of Work: on leave from [employer]   Education:  [place] arranged:  n/a  Museum/gallery curator Resources:  Medicaid   Other Resources:  [employer], Physicist, medical    Cultural/Religious Considerations Which May Impact Care:  none   Strengths:  Ability to meet basic needs , Compliance with medical plan , Home prepared for child , Pediatrician chosen   Psychotropic Medications:      [name] reported that she was taking Celexa but stopped due to not liking the way that it made her feel.    Pediatrician:    Careers adviser area  Pediatrician List:   Ecologist Other ([place]  [place]      Pediatrician Fax Number:    Risk Factors/Current Problems:  None   Cognitive State:   Insightful , Able to Concentrate , Alert    Mood/Affect:  Happy , Interested , Relaxed , Comfortable , Calm , Bright    CSW Assessment: CSW consulted due to questions around [name] having custody of her children. While in chart review, CSW noted that [name] also has a hx of anxiety and a lot of stressors during this pregnancy. CSW went to speak with [name] at bedside to address further needs.   CSW entered the room and congratulated [name] on the birth of infant. CSW advised [name] of CSW's role and the reason for CSW coming to pea with her. [name] reported that she was diagnosed with anxiety a few months ago. [name] expressed that she dealt with her mom being really sick and "then she passed away Oct 21, 2019". CSW offered [name] support as [name] spoke about her deceased mother. [name] went on to tell CSW that her mother was very sick and that [name] worried about her while being pregnant. [name] expressed that she is "dealign with it" but hasn't sought out any further resources to assist [name] in processing grief at this time. CSW offered [name] those resources in the event that [name] decides that therapy is a good fit for her. [name] thanked CSW and expressed that this year as been  a challenged not only due to [facility] passing but also due to [name] loosing her house, loosing her car, and her other items being placed in storage. [name] reported that she now has a new place and that she also has a vehicle to get her and infant to care. [name] also reported that she has a job in which [name] expressed that she is currently on leave from at this time. [name] expressed to CSW that she has no other mental health hx and reported no SI. HI or DV to this CSW at this time.   As [name] continued to speak with CSW, [name] reported that she and her children father have a custody order via courts that states that [name] and father have shared custody of other children. Per [name]'s report father gets the children on Friday by 6pm and then has to have children returned back to  [name]'s care by 4pm on Sunday. [name] expressed that this was not done via CPS and that it has been working well thus far for [name] and father. [name] reported no further court dates have been set as "I have received all my certified mail paperwork in the mail". [name] reports that she and father of other children have been to mediation in the past and report no further issus with this matter per [name].   [name] expressed that she does have a hx of CPS with [facility]. [name] reported that case was from earlier this year and that case has since been closed. CSW advised [name] That CSW would follow up with  [facility] CPS to ensure that case is closed in which [name] reported that she understood and expressed no other CPS hx to CSW.  CSW reached out to [facility] CPS to confirm this however CSW had to leave voicemail asking for call back.[name] advised CSW that infant would be seen at [facility] for further care as well as [name] expressed that she has a basinet for infant to sleep in once arrived home. [name] advised CSW that her supports are her dad and another person by the name of Andreas Newport. [name] expressed that she gets [facility] and Physicist, medical. [name] disclosed that she has all needed items to care for infant with no other needs addressed.   CSW took time to provide [name] with PPD and SIDS education. [name] was given PPD Checklist in order to keep track of feelings as they relate  To PPD. [name] was encouraged  to review sheet daily to assess feelings. [name] thanked CSW and expressed no other needs.   CSW Plan/Description:  No Further Intervention Required/No Barriers to Discharge, Sudden Infant Death Syndrome (SIDS) Education, Perinatal Mood and Anxiety Disorder (PMADs) Education    Wetzel Bjornstad, Garibaldi 11/05/2019, 9:38 AM

## 2019-11-05 NOTE — Progress Notes (Signed)
CSW received call back from Wendall Stade with [facility] CPS and was advised that [name] has no open cases at this time and that all cases have been closed as of 02/2019. No barriers per CPS.     Virgie Dad Edu On, MSW, LCSW [facility] [place] at [place] [phone]

## 2019-11-06 MED ORDER — HYDROCODONE-ACETAMINOPHEN 5-325 MG PO TABS
2.0000 | ORAL_TABLET | Freq: Four times a day (QID) | ORAL | 0 refills | Status: DC | PRN
Start: 2019-11-06 — End: 2019-11-07

## 2019-11-06 NOTE — Progress Notes (Signed)
CSW reconsulted due to [name] scoring 17 on [place] with answering 1 to question 10. CSW spoke with [name] at bedside to check in. [name] reported that she has been feeling well and expressed that her score was related to her mother passing away a few weeks ago .CSW validated these feelings. [name] reported that she answered the questions based off the last few weeks and not just the last 7 days. [name] reported that over the last 7 days she has been "gettign better" but also expressed a challenge still. [name] expressed that she is interested in therapy resources in which CSW left with [name] on yesterday. CSW also reached out to [facility] to see what other supports can be offered to [name] during the [place]. [name] was also given contact information for [facility] in the even that needs arise sooner.   CSW question [name] about scoring 1 on question 10 in which [name] expressed no SI or HI and reported "I would never want to kill myself I just have nervous breakdowns at times". CSW further encouraged  [name] to seek assistance as needed. CSW offered [name] referrals for [place], [facility], and [facility] in which [name] was agreeable to. CSW has made all referrals at this time and was assured by [name] that she is feeling well overall.     Lisa Wright [name], MSW, LCSW [facility] [place] at [place] [phone]

## 2019-11-07 ENCOUNTER — Encounter: Payer: Self-pay | Admitting: Family Medicine

## 2019-11-07 ENCOUNTER — Other Ambulatory Visit: Payer: Self-pay | Admitting: Family Medicine

## 2019-11-07 ENCOUNTER — Other Ambulatory Visit: Payer: Self-pay | Admitting: Obstetrics & Gynecology

## 2019-11-07 MED ORDER — HYDROCODONE-ACETAMINOPHEN 5-325 MG PO TABS
2.0000 | ORAL_TABLET | Freq: Four times a day (QID) | ORAL | 0 refills | Status: DC | PRN
Start: 2019-11-07 — End: 2019-11-07

## 2019-11-07 MED ORDER — HYDROCODONE-ACETAMINOPHEN 5-325 MG PO TABS
2.0000 | ORAL_TABLET | Freq: Four times a day (QID) | ORAL | 0 refills | Status: DC | PRN
Start: 2019-11-07 — End: 2022-06-04

## 2019-11-10 ENCOUNTER — Ambulatory Visit: Payer: Medicaid Other

## 2019-11-10 ENCOUNTER — Encounter: Payer: Medicaid Other | Admitting: Licensed Clinical Social Worker

## 2019-11-17 ENCOUNTER — Encounter: Payer: Medicaid Other | Admitting: Licensed Clinical Social Worker

## 2019-11-17 ENCOUNTER — Ambulatory Visit: Payer: Medicaid Other

## 2019-11-18 ENCOUNTER — Ambulatory Visit (INDEPENDENT_AMBULATORY_CARE_PROVIDER_SITE_OTHER): Payer: Medicaid Other | Admitting: Family Medicine

## 2019-11-18 ENCOUNTER — Other Ambulatory Visit: Payer: Self-pay

## 2019-11-18 DIAGNOSIS — Z5189 Encounter for other specified aftercare: Secondary | ICD-10-CM

## 2019-11-18 NOTE — Progress Notes (Signed)
Pt presents for incision check. Incision is healing well no dehiscence or drainage. Discussed signs of infection with pt. Pt will f/u at [facility] appointment.  Tamasha Laplante l Wyndham Santilli, CMA

## 2019-11-18 NOTE — Progress Notes (Signed)
Chart reviewed - agree with CMA/RN documentation.  ° °

## 2019-12-04 ENCOUNTER — Ambulatory Visit: Payer: Medicaid Other | Admitting: Family Medicine

## 2019-12-07 ENCOUNTER — Ambulatory Visit: Payer: Medicaid Other | Admitting: Obstetrics & Gynecology

## 2020-09-04 ENCOUNTER — Other Ambulatory Visit: Payer: Self-pay | Admitting: Family Medicine

## 2021-02-22 ENCOUNTER — Encounter: Payer: Self-pay | Admitting: Obstetrics

## 2021-02-22 ENCOUNTER — Ambulatory Visit: Payer: Medicaid Other | Admitting: Obstetrics

## 2021-02-22 ENCOUNTER — Other Ambulatory Visit: Payer: Self-pay

## 2021-02-22 VITALS — BP 122/74 | HR 69 | Wt 259.0 lb

## 2021-02-22 DIAGNOSIS — N898 Other specified noninflammatory disorders of vagina: Secondary | ICD-10-CM | POA: Diagnosis not present

## 2021-02-22 DIAGNOSIS — R300 Dysuria: Secondary | ICD-10-CM | POA: Diagnosis not present

## 2021-02-22 DIAGNOSIS — N971 Female infertility of tubal origin: Secondary | ICD-10-CM | POA: Diagnosis not present

## 2021-02-22 LAB — POCT URINALYSIS DIPSTICK
Bilirubin, UA: NEGATIVE
Blood, UA: NEGATIVE
Glucose, UA: NEGATIVE
Ketones, UA: NEGATIVE
Nitrite, UA: NEGATIVE
Protein, UA: NEGATIVE
Spec Grav, UA: 1.01 (ref 1.010–1.025)
Urobilinogen, UA: 0.2 E.U./dL
pH, UA: 6 (ref 5.0–8.0)

## 2021-02-22 MED ORDER — CEFUROXIME AXETIL 500 MG PO TABS: 500.0000 mg | ORAL_TABLET | Freq: Two times a day (BID) | ORAL | 0 refills | Status: DC

## 2021-02-22 NOTE — Progress Notes (Signed)
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 Pt reports vaginal irritation and would like testing today. Pt also reports pain with urination, advised to leave a urine sample. Hx of BTL LMP 01-29-21

## 2021-02-22 NOTE — Progress Notes (Signed)
Patient ID: Lisa Wright, female   DOB: 1985-07-06, 36 y.o.   MRN: [id number]  Chief Complaint  Patient presents with   GYN    HPI Lisa Wright is a 36 y.o. female.  Referred for evaluation of vaginal warts. HPI  Past Medical History:  Diagnosis Date   Cervical cancer ([place]) 2016   Chlamydia    Migraines    Trichomonas infection    Vaginal Pap smear, abnormal     Past Surgical History:  Procedure Laterality Date   CESAREAN SECTION     CESAREAN SECTION     CESAREAN SECTION N/A 11/04/2019   Procedure: CESAREAN SECTION;  Surgeon: Mora Bellman, MD;  Location: [facility];  Service: Obstetrics;  Laterality: N/A;   COLPOSCOPY      Family History  Problem Relation Age of Onset   Diabetes Father    Hypertension Father    Dementia Maternal Grandmother    Diabetes Maternal Grandfather    Diabetes Paternal Grandmother    Stroke Mother    Lung cancer Mother     Social History Social History   Tobacco Use   Smoking status: Former    Packs/day: 0.30    Years: 14.00    Pack years: 4.20    Types: Cigarettes    Quit date: 10/29/2019    Years since quitting: 1.3   Smokeless tobacco: Never   Tobacco comments:    1 pack every 3 days  Vaping Use   Vaping Use: Never used  Substance Use Topics   Alcohol use: No    Alcohol/week: 0.0 standard drinks   Drug use: No    Allergies  Allergen Reactions   Ibuprofen Nausea And Vomiting   Citalopram     Felt like a zombie    Percocet [Oxycodone-Acetaminophen] Itching    Can tolerate hydrocodone    Current Outpatient Medications  Medication Sig Dispense Refill   cefUROXime (CEFTIN) 500 MG tablet Take 1 tablet (500 mg total) by mouth 2 (two) times daily with a meal. 14 tablet 0   pantoprazole (PROTONIX) 40 MG tablet Take 1 tablet (40 mg total) by mouth daily. 30 tablet 3   acetaminophen (TYLENOL) 500 MG tablet Take 1,000 mg by mouth every 6 (six) hours as needed for moderate pain or headache. (Patient not taking: Reported  on 11/18/2019)     AMBULATORY NON FORMULARY MEDICATION 1 Device by Other route once a week. Blood pressure cuff/ Large  Monitored Regularly at home  ICD 10 Z34.90 LROB (Patient not taking: Reported on 11/18/2019) 1 kit 0   citalopram (CELEXA) 10 MG tablet Take 0.5 tablets (5 mg total) by mouth daily. (Patient not taking: Reported on 10/26/2019) 15 tablet 3   citalopram (CELEXA) 20 MG tablet Take 1 tablet (20 mg total) by mouth daily. (Patient not taking: Reported on 02/22/2021) 90 tablet 3   HYDROcodone-acetaminophen (NORCO/VICODIN) 5-325 MG tablet Take 2 tablets by mouth every 6 (six) hours as needed for moderate pain. (Patient not taking: Reported on 02/22/2021) 30 tablet 0   Prenatal Vit-Fe Fumarate-FA (PREPLUS) 27-1 MG TABS Take 1 tablet by mouth daily. (Patient not taking: Reported on 02/22/2021) 30 tablet 13   No current facility-administered medications for this visit.    Review of Systems Review of Systems Constitutional: negative for fatigue and weight loss Respiratory: negative for cough and wheezing Cardiovascular: negative for chest pain, fatigue and palpitations Gastrointestinal: negative for abdominal pain and change in bowel habits Genitourinary: positive for vaginal " warts "  Integument/breast: negative for nipple discharge Musculoskeletal:negative for myalgias Neurological: negative for gait problems and tremors Behavioral/Psych: negative for abusive relationship, depression Endocrine: negative for temperature intolerance      Blood pressure 122/74, pulse 69, weight 259 lb (117.5 kg), last menstrual period 01/29/2021, unknown if currently breastfeeding.  Physical Exam Physical Exam General:   Alert and no distress  Skin:   no rash or abnormalities  Lungs:   clear to auscultation bilaterally  Heart:   regular rate and rhythm, S1, S2 normal, no murmur, click, rub or gallop  Breasts:   Not examined  Abdomen:  normal findings: no organomegaly, soft, non-tender and no hernia   Pelvis:  External genitalia: epidermoid cysts noted of labia, asymptomatic Urinary system: urethral meatus normal and bladder without fullness, nontender Vaginal: normal without tenderness, induration or masses Cervix: normal appearance Adnexa: normal bimanual exam Uterus: anteverted and non-tender, normal size    I have spent a total of 30 minutes of face-to-face time, excluding clinical staff time, reviewing notes and preparing to see patient, ordering tests and/or medications, and counseling the patient.    Data Reviewed Urinalysis  Assessment     1. Vaginal epidermal inclusion cysts, asymptomatic - she desires excision  2. Dysuria Rx: - Urine Culture - POCT urinalysis dipstick - cefUROXime (CEFTIN) 500 MG tablet; Take 1 tablet (500 mg total) by mouth 2 (two) times daily with a meal.  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    Dispense:  14 tablet    Refill:  0       Shelly Bombard, MD 02/22/2021 9:42 AM

## 2021-02-24 LAB — URINE CULTURE

## 2021-03-08 ENCOUNTER — Encounter: Payer: Self-pay | Admitting: Obstetrics

## 2021-03-08 ENCOUNTER — Telehealth (INDEPENDENT_AMBULATORY_CARE_PROVIDER_SITE_OTHER): Payer: Medicaid Other | Admitting: Obstetrics

## 2021-03-08 DIAGNOSIS — N971 Female infertility of tubal origin: Secondary | ICD-10-CM | POA: Diagnosis not present

## 2021-03-08 DIAGNOSIS — N898 Other specified noninflammatory disorders of vagina: Secondary | ICD-10-CM | POA: Diagnosis not present

## 2021-03-08 DIAGNOSIS — R350 Frequency of micturition: Secondary | ICD-10-CM

## 2021-03-08 NOTE — Progress Notes (Signed)
MyChart 2 week follow up Reports cont'd urinary symptoms, on day 7 of ceftin

## 2021-03-08 NOTE — Progress Notes (Signed)
° °  TELEHEALTH GYNECOLOGY VISIT ENCOUNTER NOTE  Provider location: [facility] [place] [facility]   Patient location: Home  I connected with Lisa Wright on 03/08/21 at  4:10 PM EST by telephone and verified that I am speaking with the correct person using two identifiers. Patient was unable to do MyChart audiovisual encounter due to technical difficulties, she tried several times.    I discussed the limitations, risks, security and privacy concerns of performing an evaluation and management service by telephone and the availability of in person appointments. I also discussed with the patient that there may be a patient responsible charge related to this service. The patient expressed understanding and agreed to proceed.   History:  Lisa Wright is a 36 y.o. [id number] female being evaluated today for increased urinary frequency for the past year since delivering her baby. She denies any abnormal vaginal discharge, bleeding, pelvic pain or other concerns.       Past Medical History:  Diagnosis Date   Cervical cancer ([place]) 2016   Chlamydia    Migraines    Trichomonas infection    Vaginal Pap smear, abnormal    Past Surgical History:  Procedure Laterality Date   CESAREAN SECTION     CESAREAN SECTION     CESAREAN SECTION N/A 11/04/2019   Procedure: CESAREAN SECTION;  Surgeon: Mora Bellman, MD;  Location: [facility];  Service: Obstetrics;  Laterality: N/A;   COLPOSCOPY     The following portions of the patient's history were reviewed and updated as appropriate: allergies, current medications, past family history, past medical history, past social history, past surgical history and problem list.   Health Maintenance:  Normal pap and negative HRHPV on 11-04-2019.    Review of Systems:  Pertinent items noted in HPI and remainder of comprehensive ROS otherwise negative.  Physical Exam:   General:  Alert, oriented and cooperative.   Mental Status: Normal mood and affect  perceived. Normal judgment and thought content.  Physical exam deferred due to nature of the encounter  Labs and Imaging No results found for this or any previous visit (from the past 336 hour(s)). No results found.    Assessment and Plan:     1. Increased urinary frequency Rx: - Ambulatory referral to Urogynecology  2. Vaginal inclusion cyst - small, asypmptomatic, but she wants them excised.  3. Infertility of tubal origin Rx: - Ambulatory referral to Reproductive Endocrinology       I discussed the assessment and treatment plan with the patient. The patient was provided an opportunity to ask questions and all were answered. The patient agreed with the plan and demonstrated an understanding of the instructions.   The patient was advised to call back or seek an in-person evaluation/go to the ED if the symptoms worsen or if the condition fails to improve as anticipated.  I have spent a total of 10 minutes of non-face-to-face time, excluding clinical staff time, reviewing notes and preparing to see patient, ordering tests and/or medications, and counseling the patient.    Baltazar Najjar, MD [facility], [place], [facility] 03/08/21

## 2021-04-13 ENCOUNTER — Institutional Professional Consult (permissible substitution): Payer: Medicaid Other | Admitting: Obstetrics and Gynecology

## 2021-05-03 ENCOUNTER — Institutional Professional Consult (permissible substitution): Payer: Medicaid Other | Admitting: Obstetrics and Gynecology

## 2021-07-17 ENCOUNTER — Institutional Professional Consult (permissible substitution): Payer: Medicaid Other | Admitting: Obstetrics and Gynecology

## 2022-02-15 ENCOUNTER — Telehealth: Payer: Medicaid Other | Admitting: Physician Assistant

## 2022-02-15 DIAGNOSIS — B9689 Other specified bacterial agents as the cause of diseases classified elsewhere: Secondary | ICD-10-CM

## 2022-02-15 DIAGNOSIS — N760 Acute vaginitis: Secondary | ICD-10-CM

## 2022-02-16 MED ORDER — METRONIDAZOLE 500 MG PO TABS: 500.0000 mg | ORAL_TABLET | Freq: Two times a day (BID) | ORAL | 0 refills | Status: AC

## 2022-02-16 NOTE — Progress Notes (Signed)
E-Visit for Vaginal Symptoms  We are sorry that you are not feeling well. Here is how we plan to help! Based on what you shared with me it looks like you: May have a vaginosis due to bacteria  Vaginosis is an inflammation of the vagina that can result in discharge, itching and pain. The cause is usually a change in the normal balance of vaginal bacteria or an infection. Vaginosis can also result from reduced estrogen levels after menopause.  The most common causes of vaginosis are:   Bacterial vaginosis which results from an overgrowth of one on several organisms that are normally present in your vagina.   Yeast infections which are caused by a naturally occurring fungus called candida.   Vaginal atrophy (atrophic vaginosis) which results from the thinning of the vagina from reduced estrogen levels after menopause.   Trichomoniasis which is caused by a parasite and is commonly transmitted by sexual intercourse.  Factors that increase your risk of developing vaginosis include: Medications, such as antibiotics and steroids Uncontrolled diabetes Use of hygiene products such as bubble bath, vaginal spray or vaginal deodorant Douching Wearing damp or tight-fitting clothing Using an intrauterine device (IUD) for birth control Hormonal changes, such as those associated with pregnancy, birth control pills or menopause Sexual activity Having a sexually transmitted infection  Your treatment plan is Metronidazole or Flagyl 500mg twice a day for 7 days.  I have electronically sent this prescription into the pharmacy that you have chosen.  Be sure to take all of the medication as directed. Stop taking any medication if you develop a rash, tongue swelling or shortness of breath. Mothers who are breast feeding should consider pumping and discarding their breast milk while on these antibiotics. However, there is no consensus that infant exposure at these doses would be harmful.  Remember that  medication creams can weaken latex condoms. .   HOME CARE:  Good hygiene may prevent some types of vaginosis from recurring and may relieve some symptoms:  Avoid baths, hot tubs and whirlpool spas. Rinse soap from your outer genital area after a shower, and dry the area well to prevent irritation. Don't use scented or harsh soaps, such as those with deodorant or antibacterial action. Avoid irritants. These include scented tampons and pads. Wipe from front to back after using the toilet. Doing so avoids spreading fecal bacteria to your vagina.  Other things that may help prevent vaginosis include:  Don't douche. Your vagina doesn't require cleansing other than normal bathing. Repetitive douching disrupts the normal organisms that reside in the vagina and can actually increase your risk of vaginal infection. Douching won't clear up a vaginal infection. Use a latex condom. Both female and female latex condoms may help you avoid infections spread by sexual contact. Wear cotton underwear. Also wear pantyhose with a cotton crotch. If you feel comfortable without it, skip wearing underwear to bed. Yeast thrives in moist environments Your symptoms should improve in the next day or two.  GET HELP RIGHT AWAY IF:  You have pain in your lower abdomen ( pelvic area or over your ovaries) You develop nausea or vomiting You develop a fever Your discharge changes or worsens You have persistent pain with intercourse You develop shortness of breath, a rapid pulse, or you faint.  These symptoms could be signs of problems or infections that need to be evaluated by a medical provider now.  MAKE SURE YOU   Understand these instructions. Will watch your condition. Will get help right   away if you are not doing well or get worse.  Thank you for choosing an e-visit.  Your e-visit answers were reviewed by a board certified advanced clinical practitioner to complete your personal care plan. Depending upon the  condition, your plan could have included both over the counter or prescription medications.  Please review your pharmacy choice. Make sure the pharmacy is open so you can pick up prescription now. If there is a problem, you may contact your provider through MyChart messaging and have the prescription routed to another pharmacy.  Your safety is important to us. If you have drug allergies check your prescription carefully.   For the next 24 hours you can use MyChart to ask questions about today's visit, request a non-urgent call back, or ask for a work or school excuse. You will get an email in the next two days asking about your experience. I hope that your e-visit has been valuable and will speed your recovery.  I have spent 5 minutes in review of e-visit questionnaire, review and updating patient chart, medical decision making and response to patient.   Jalyiah Shelley M Caedan Sumler, PA-C  

## 2022-03-16 ENCOUNTER — Telehealth: Payer: Medicaid Other | Admitting: Physician Assistant

## 2022-03-16 DIAGNOSIS — T3695XA Adverse effect of unspecified systemic antibiotic, initial encounter: Secondary | ICD-10-CM

## 2022-03-16 DIAGNOSIS — B379 Candidiasis, unspecified: Secondary | ICD-10-CM | POA: Diagnosis not present

## 2022-03-16 MED ORDER — FLUCONAZOLE 150 MG PO TABS: 150.0000 mg | ORAL_TABLET | ORAL | 0 refills | Status: DC | PRN

## 2022-03-16 NOTE — Progress Notes (Signed)
E-Visit for Vaginal Symptoms  We are sorry that you are not feeling well. Here is how we plan to help! Based on what you shared with me it looks like you: May have a yeast vaginosis secondary to recent antibiotic use.   Vaginosis is an inflammation of the vagina that can result in discharge, itching and pain. The cause is usually a change in the normal balance of vaginal bacteria or an infection. Vaginosis can also result from reduced estrogen levels after menopause.  The most common causes of vaginosis are:   Bacterial vaginosis which results from an overgrowth of one on several organisms that are normally present in your vagina.   Yeast infections which are caused by a naturally occurring fungus called candida.   Vaginal atrophy (atrophic vaginosis) which results from the thinning of the vagina from reduced estrogen levels after menopause.   Trichomoniasis which is caused by a parasite and is commonly transmitted by sexual intercourse.  Factors that increase your risk of developing vaginosis include: Medications, such as antibiotics and steroids Uncontrolled diabetes Use of hygiene products such as bubble bath, vaginal spray or vaginal deodorant Douching Wearing damp or tight-fitting clothing Using an intrauterine device (IUD) for birth control Hormonal changes, such as those associated with pregnancy, birth control pills or menopause Sexual activity Having a sexually transmitted infection  Your treatment plan is Diflucan (fluconazole) '150mg'$  tablet once, may repeat in 72 hours if needed.  I have electronically sent this prescription into the pharmacy that you have chosen.  Be sure to take all of the medication as directed. Stop taking any medication if you develop a rash, tongue swelling or shortness of breath. Mothers who are breast feeding should consider pumping and discarding their breast milk while on these antibiotics. However, there is no consensus that infant exposure at these  doses would be harmful.  Remember that medication creams can weaken latex condoms. Marland Kitchen   HOME CARE:  Good hygiene may prevent some types of vaginosis from recurring and may relieve some symptoms:  Avoid baths, hot tubs and whirlpool spas. Rinse soap from your outer genital area after a shower, and dry the area well to prevent irritation. Don't use scented or harsh soaps, such as those with deodorant or antibacterial action. Avoid irritants. These include scented tampons and pads. Wipe from front to back after using the toilet. Doing so avoids spreading fecal bacteria to your vagina.  Other things that may help prevent vaginosis include:  Don't douche. Your vagina doesn't require cleansing other than normal bathing. Repetitive douching disrupts the normal organisms that reside in the vagina and can actually increase your risk of vaginal infection. Douching won't clear up a vaginal infection. Use a latex condom. Both female and female latex condoms may help you avoid infections spread by sexual contact. Wear cotton underwear. Also wear pantyhose with a cotton crotch. If you feel comfortable without it, skip wearing underwear to bed. Yeast thrives in [employer] Your symptoms should improve in the next day or two.  GET HELP RIGHT AWAY IF:  You have pain in your lower abdomen ( pelvic area or over your ovaries) You develop nausea or vomiting You develop a fever Your discharge changes or worsens You have persistent pain with intercourse You develop shortness of breath, a rapid pulse, or you faint.  These symptoms could be signs of problems or infections that need to be evaluated by a medical provider now.  MAKE SURE YOU   Understand these instructions. Will watch  your condition. Will get help right away if you are not doing well or get worse.  Thank you for choosing an e-visit.  Your e-visit answers were reviewed by a board certified advanced clinical practitioner to complete  your personal care plan. Depending upon the condition, your plan could have included both over the counter or prescription medications.  Please review your pharmacy choice. Make sure the pharmacy is open so you can pick up prescription now. If there is a problem, you may contact your provider through [employer] and have the prescription routed to another pharmacy.  Your safety is important to [place]. If you have drug allergies check your prescription carefully.   For the next 24 hours you can use MyChart to ask questions about today's visit, request a non-urgent call back, or ask for a work or school excuse. You will get an email in the next two days asking about your experience. I hope that your e-visit has been valuable and will speed your recovery.  I have spent 5 minutes in review of e-visit questionnaire, review and updating patient chart, medical decision making and response to patient.   Mar Daring, PA-C

## 2022-04-12 ENCOUNTER — Institutional Professional Consult (permissible substitution): Payer: Medicaid Other | Admitting: Obstetrics and Gynecology

## 2022-05-01 IMAGING — US US MFM OB DETAIL+14 WK
2 series · 13 of 28 positions shown · non-contrast
Comparison: none

[Series 1: us mfm ob detail+14 wk · 11 of 77 slices shown (1 of 2)]
[im 4/77]
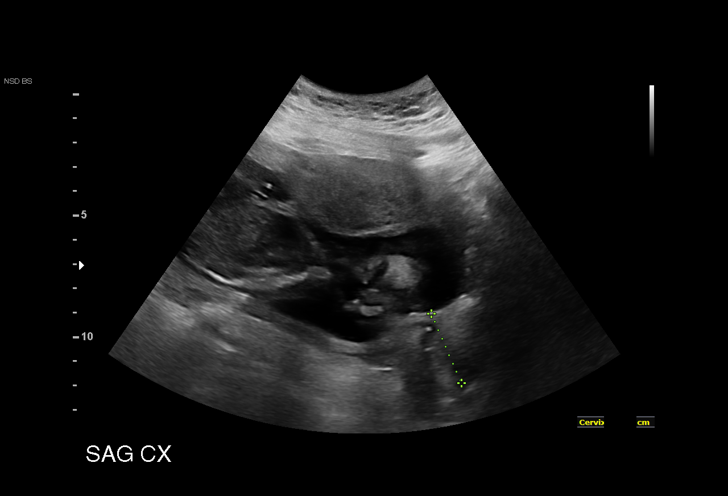
[im 11/77]
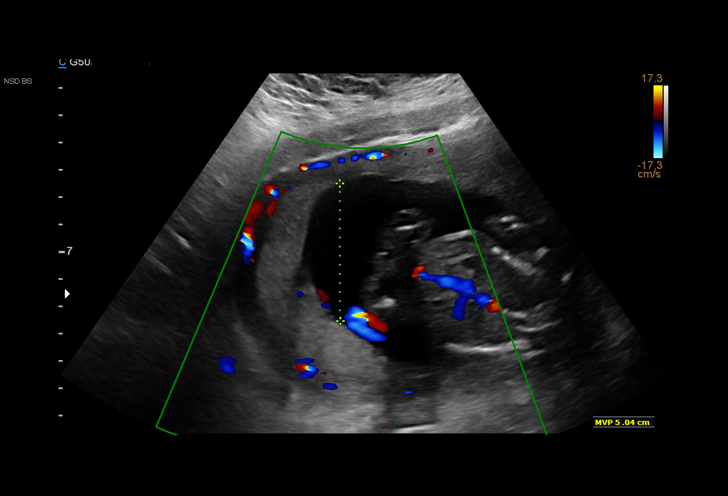
[im 18/77]
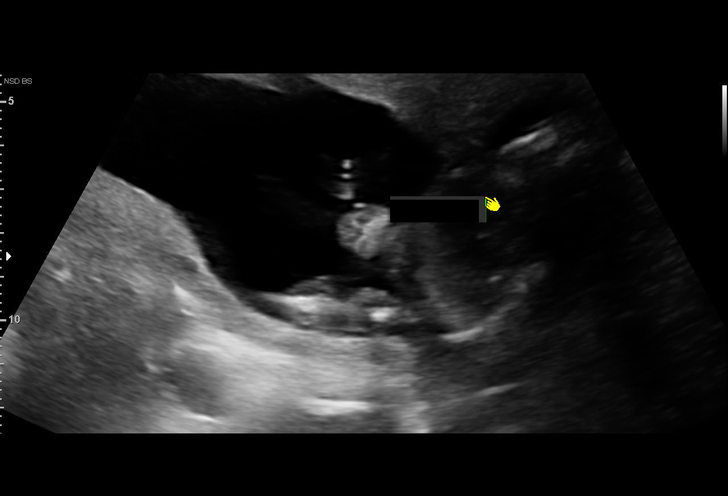
[im 25/77]
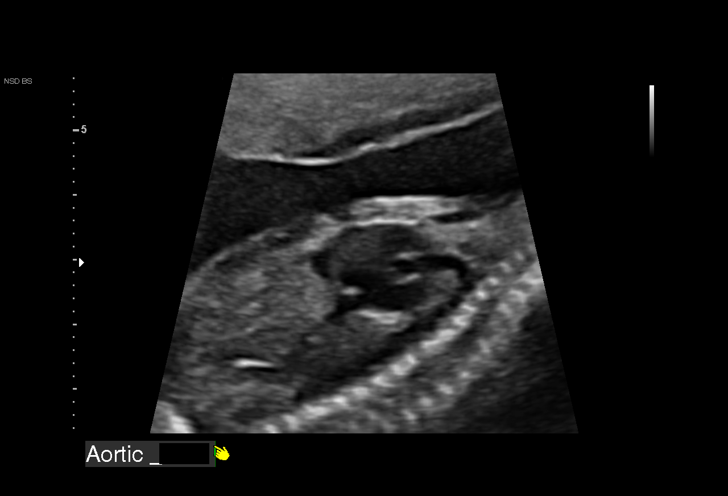
[im 32/77]
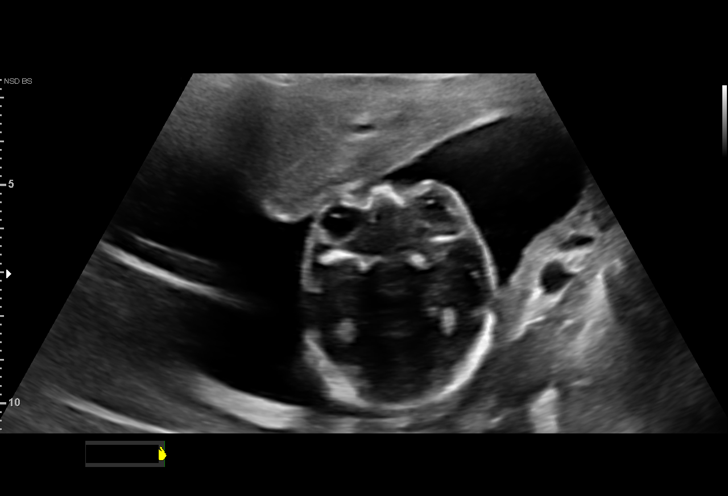
[im 39/77]
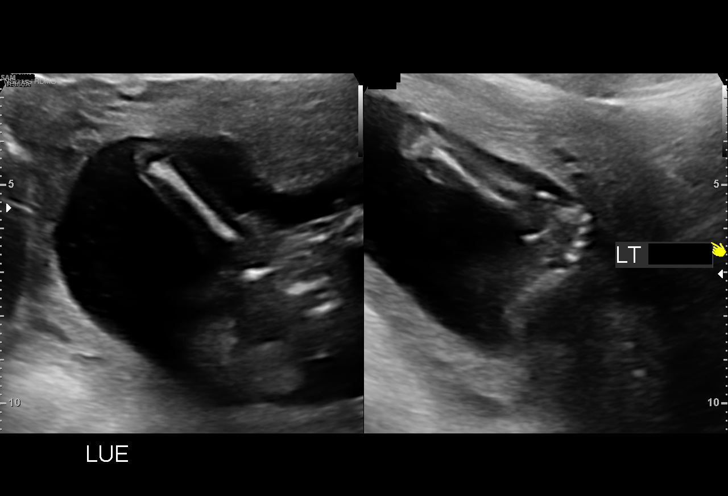
[im 49/77]
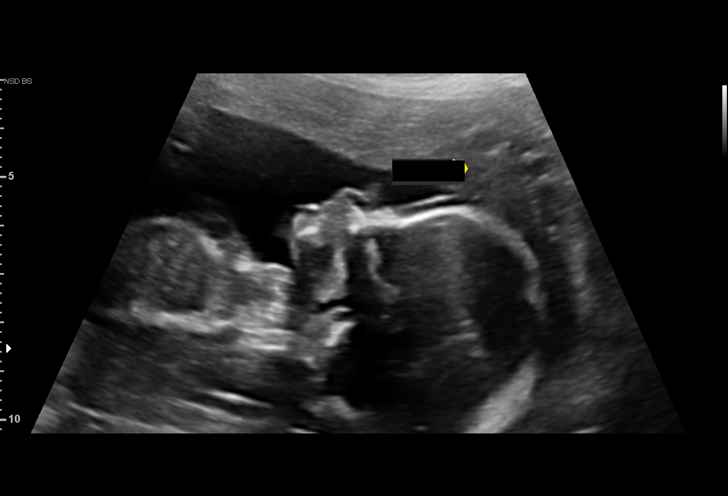
[im 56/77]
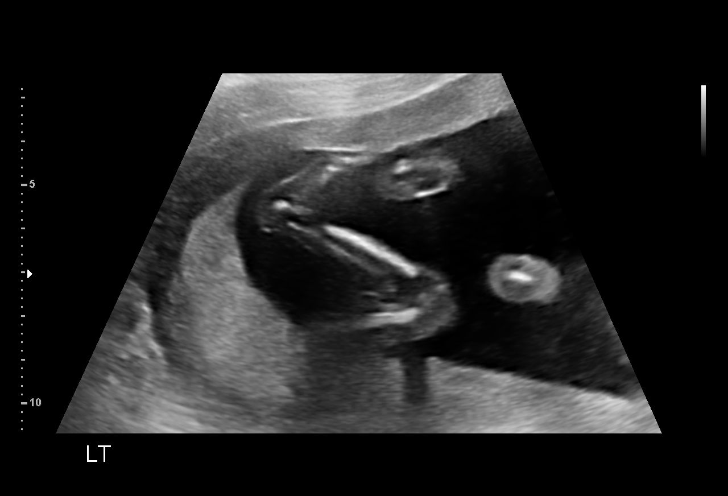
[im 63/77]
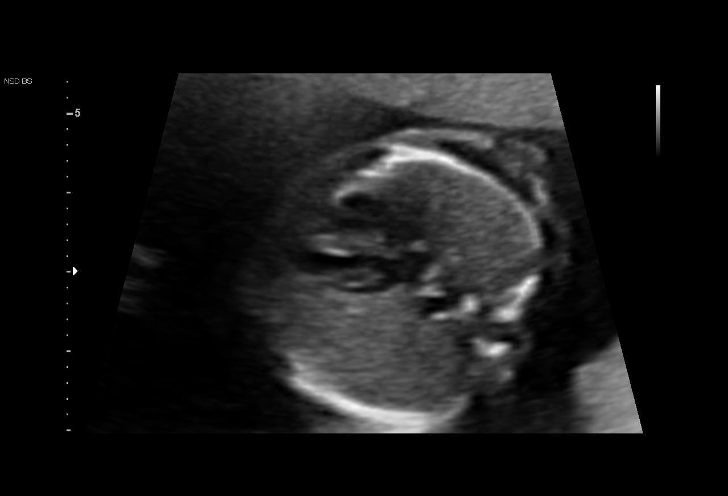
[im 70/77]
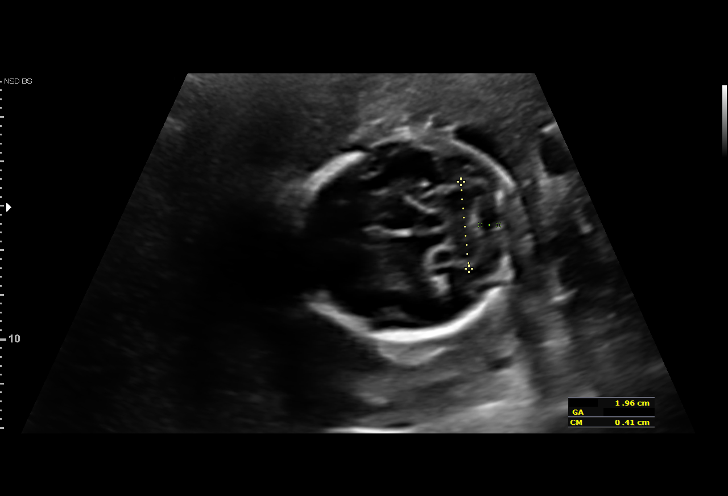
[im 77/77]
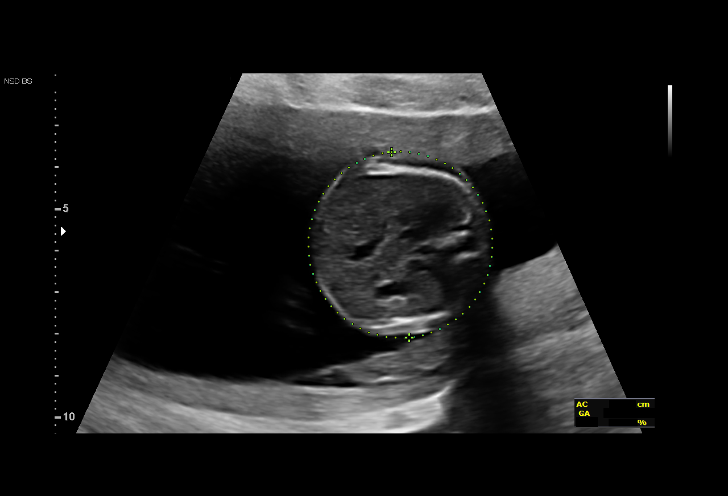

[Series 3: us mfm ob detail+14 wk · 2 of 18 slices shown (2 of 2)]
[im 5/18]
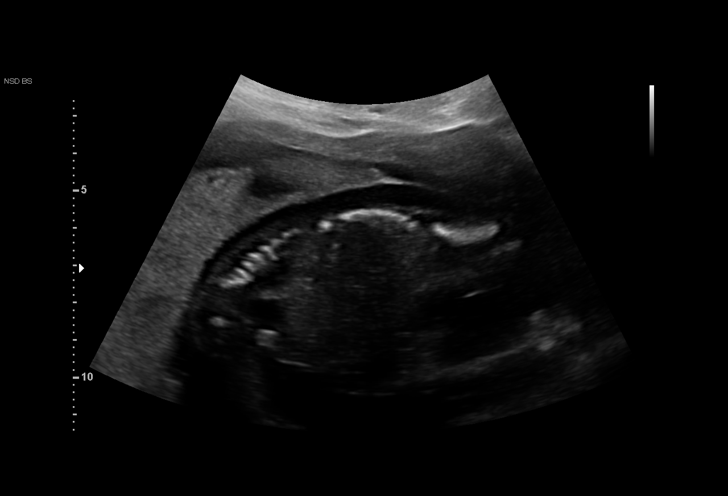
[im 13/18]
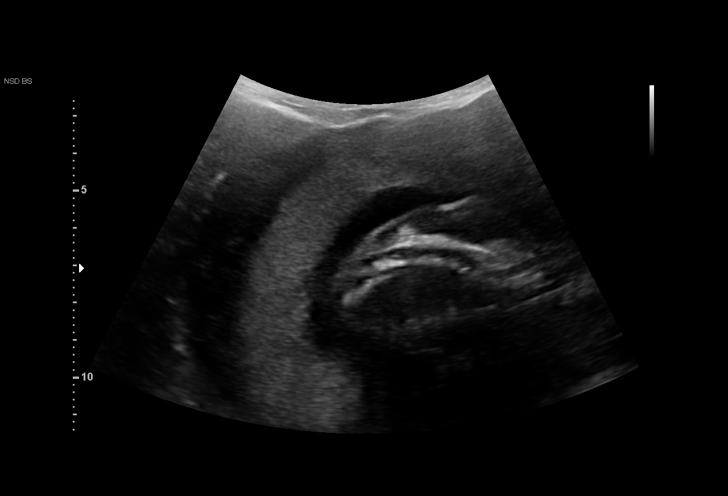

[13 of 28 positions shown; findings below may reference images not displayed]

CNM

Indications

 Encounter for antenatal screening for
 malformations (LR Nips, Negative AFP)
 19 weeks gestation of pregnancy
 Previous cesarean delivery, antepartum x 2
Fetal Evaluation

 Num Of Fetuses:          1
 Fetal Heart Rate(bpm):   157
 Cardiac Activity:        Observed
 Presentation:            Cephalic
 Placenta:                Fundal
 P. Cord Insertion:       Visualized, central

 Amniotic Fluid
 AFI FV:      Within normal limits

                             Largest Pocket(cm)
                             5.
Biometry

 BPD:      43.6   mm     G. Age:  19w 1d         59  %    CI:         75.18  %    70 - 86
                                                          FL/HC:       17.2  %    16.1 -
 HC:      159.5   mm     G. Age:  18w 6d         32  %    HC/AC:       1.14       1.09 -
 AC:      139.8   mm     G. Age:  19w 3d         59  %    FL/BPD:      62.8  %
 FL:       27.4   mm     G. Age:  18w 3d         22  %    FL/AC:       19.6  %    20 - 24
 HUM:      25.9   mm     G. Age:  18w 1d         26  %
 CER:      19.3   mm     G. Age:  18w 5d         40  %
 LV:         7.1  mm
 CM:         4.1  mm

 Est. FW:     263   gm      0 lb 9 oz     39  %
OB History

 Gravidity:     5         Term:  3          Prem:  0        SAB:   0
 TOP:           1       Ectopic: 0         Living: 3
Gestational Age

 LMP:            19w 6d       Date:  01/29/19                   EDD:  11/05/19
 U/S Today:      19w 0d                                         EDD:  11/11/19
 Best:           19w 0d    Det. By:  Early Ultrasound           EDD:  11/11/19
                                     (04/07/19)
Anatomy

 Cranium:                Appears normal         LVOT:                   Appears normal
 Cavum:                  Appears normal         Aortic Arch:            Appears normal
 Ventricles:             Appears normal         Ductal Arch:            Appears normal
 Choroid Plexus:         Appears normal         Diaphragm:              Appears normal
 Cerebellum:             Appears normal         Stomach:                Appears normal, left
                                                                        sided
 Posterior Fossa:        Appears normal         Abdomen:                Appears normal
 Nuchal Fold:            Appears normal         Abdominal Wall:         Appears nml (cord
                                                                        insert, abd wall)
 Face:                   Appears normal         Cord Vessels:           Appears normal (3
                         (orbits and profile)                           vessel cord)
 Lips:                   Appears normal         Kidneys:                Appear normal
 Palate:                 Appears normal         Bladder:                Appears normal
 Thoracic:               Appears normal         Spine:                  Not well visualized
 Heart:                  Appears normal         Upper Extremities:      Visualized
                         (4CH, axis, and situs)
 RVOT:                   Appears normal         Lower Extremities:      Visualized

 Other:   Fetus appears to be a male. Heels visualized. Nasal bone visualized.
          Technically difficult due to maternal habitus and fetal position.
Cervix Uterus Adnexa

 Cervix
 Length:            3.11  cm.
 Normal appearance by transabdominal scan.

 Right Ovary
 Not visualized.

 Left Ovary
 Not visualized.

 Adnexa
 No abnormality visualized.
Impression

 Single intrauterine pregnancy with measurements consistent
 with dates
 Normal anatomy seen, however suboptimal views of the fetal
 anatomy was observed secondary to fetal position.
 LR NIPS
Recommendations

 Follow up anatomy in 4 weeks.

## 2022-05-04 ENCOUNTER — Ambulatory Visit: Payer: Medicaid Other | Admitting: Obstetrics

## 2022-05-11 ENCOUNTER — Ambulatory Visit (INDEPENDENT_AMBULATORY_CARE_PROVIDER_SITE_OTHER): Payer: Medicaid Other | Admitting: Obstetrics

## 2022-05-11 ENCOUNTER — Other Ambulatory Visit (HOSPITAL_COMMUNITY)
Admission: RE | Admit: 2022-05-11 | Discharge: 2022-05-11 | Disposition: A | Discharge status: Home / Self Care | Payer: Medicaid Other | Source: Ambulatory Visit | Attending: Obstetrics | Admitting: Obstetrics

## 2022-05-11 ENCOUNTER — Encounter: Payer: Self-pay | Admitting: Obstetrics

## 2022-05-11 VITALS — BP 137/81 | HR 64 | Ht 67.0 in | Wt 270.0 lb

## 2022-05-11 DIAGNOSIS — N898 Other specified noninflammatory disorders of vagina: Secondary | ICD-10-CM | POA: Insufficient documentation

## 2022-05-11 DIAGNOSIS — Z113 Encounter for screening for infections with a predominantly sexual mode of transmission: Secondary | ICD-10-CM | POA: Diagnosis not present

## 2022-05-11 DIAGNOSIS — J301 Allergic rhinitis due to pollen: Secondary | ICD-10-CM

## 2022-05-11 DIAGNOSIS — F321 Major depressive disorder, single episode, moderate: Secondary | ICD-10-CM | POA: Diagnosis not present

## 2022-05-11 DIAGNOSIS — Z01419 Encounter for gynecological examination (general) (routine) without abnormal findings: Secondary | ICD-10-CM | POA: Insufficient documentation

## 2022-05-11 MED ORDER — CETIRIZINE HCL 10 MG PO TABS: 10.0000 mg | ORAL_TABLET | Freq: Every day | ORAL | 11 refills | Status: DC

## 2022-05-11 NOTE — Progress Notes (Signed)
Subjective:        Lisa Wright is a 37 y.o. female here for a routine exam.  Current complaints: Vaginal discharge.    Personal health questionnaire:  Is patient Ashkenazi Jewish, have a family history of breast and/or ovarian cancer: no Is there a family history of uterine cancer diagnosed at age < 80, gastrointestinal cancer, urinary tract cancer, family member who is a Personnel officer syndrome-associated carrier: no Is the patient overweight and hypertensive, family history of diabetes, personal history of gestational diabetes, preeclampsia or PCOS: no Is patient over 85, have PCOS,  family history of premature CHD under age 53, diabetes, smoke, have hypertension or peripheral artery disease:  no At any time, has a partner hit, kicked or otherwise hurt or frightened you?: no Over the past 2 weeks, have you felt down, depressed or hopeless?: yes Over the past 2 weeks, have you felt little interest or pleasure in doing things?:yes   Gynecologic History Patient's last menstrual period was 05/01/2022 (exact date). Contraception: tubal ligation Last Pap: 2017. Results were: normal Last mammogram: n/a. Results were: n/a  Obstetric History OB History  Gravida Para Term Preterm AB Living  SAB IAB Ectopic Multiple Live Births    1   0 4    # Outcome Date GA Lbr Len/2nd Weight Sex Delivery Anes PTL Lv  5 Term 11/04/19 [redacted]w[redacted]d  5 lb 10.1 oz (2.555 kg) M CS-LTranv Spinal  LIV  4 IAB 11/2017          3 Term 06/01/09 [redacted]w[redacted]d  5 lb 4 oz (2.381 kg) M CS-LTranv EPI  LIV  2 Term 03/11/07 [redacted]w[redacted]d  5 lb 6 oz (2.438 kg) M CS-LTranv EPI  LIV  1 Term 04/21/04 [redacted]w[redacted]d  6 lb 10 oz (3.005 kg) M Vag-Spont EPI  LIV    Past Medical History:  Diagnosis Date   Cervical cancer 2016   Chlamydia    Migraines    Trichomonas infection    Vaginal Pap smear, abnormal     Past Surgical History:  Procedure Laterality Date   CESAREAN SECTION     CESAREAN SECTION     CESAREAN SECTION N/A 11/04/2019    Procedure: CESAREAN SECTION;  Surgeon: Catalina Antigua, MD;  Location: [facility];  Service: Obstetrics;  Laterality: N/A;   COLPOSCOPY       Current Outpatient Medications:    cetirizine (ZYRTEC ALLERGY) 10 MG tablet, Take 1 tablet (10 mg total) by mouth daily., Disp: 30 tablet, Rfl: 11   doxycycline (VIBRA-TABS) 100 MG tablet, Take 100 mg by mouth 2 (two) times daily., Disp: , Rfl:    omeprazole (PRILOSEC) 40 MG capsule, Take 40 mg by mouth daily., Disp: , Rfl:    pantoprazole (PROTONIX) 40 MG tablet, Take 1 tablet (40 mg total) by mouth daily., Disp: 30 tablet, Rfl: 3   predniSONE (STERAPRED UNI-PAK 21 TAB) 5 MG (21) TBPK tablet, Take 10 mg by mouth See admin instructions. follow package directions, Disp: , Rfl:    Vitamin D, Ergocalciferol, (DRISDOL) 1.25 MG (50000 UNIT) CAPS capsule, Take 50,000 Units by mouth once a week., Disp: , Rfl:    acetaminophen (TYLENOL) 500 MG tablet, Take 1,000 mg by mouth every 6 (six) hours as needed for moderate pain or headache. (Patient not taking: Reported on 11/18/2019), Disp: , Rfl:    AMBULATORY NON FORMULARY MEDICATION, 1 Device by Other route once a week. Blood pressure cuff/ Large  Monitored  Regularly at home  ICD 10 Z34.90 LROB, Disp: 1 kit, Rfl: 0   cefUROXime (CEFTIN) 500 MG tablet, Take 1 tablet (500 mg total) by mouth 2 (two) times daily with a meal. (Patient not taking: Reported on 05/11/2022), Disp: 14 tablet, Rfl: 0   citalopram (CELEXA) 10 MG tablet, Take 0.5 tablets (5 mg total) by mouth daily. (Patient not taking: Reported on 05/11/2022), Disp: 15 tablet, Rfl: 3   citalopram (CELEXA) 20 MG tablet, Take 1 tablet (20 mg total) by mouth daily. (Patient not taking: Reported on 05/11/2022), Disp: 90 tablet, Rfl: 3   fluconazole (DIFLUCAN) 150 MG tablet, Take 1 tablet (150 mg total) by mouth every 3 (three) days as needed. (Patient not taking: Reported on 05/11/2022), Disp: 3 tablet, Rfl: 0   HYDROcodone-acetaminophen (NORCO/VICODIN) 5-325 MG tablet, Take  2 tablets by mouth every 6 (six) hours as needed for moderate pain. (Patient not taking: Reported on 05/11/2022), Disp: 30 tablet, Rfl: 0   Prenatal Vit-Fe Fumarate-FA (PREPLUS) 27-1 MG TABS, Take 1 tablet by mouth daily. (Patient not taking: Reported on 05/11/2022), Disp: 30 tablet, Rfl: 13 Allergies  Allergen Reactions   Ibuprofen Nausea And Vomiting   Citalopram     Felt like a zombie    Percocet [Oxycodone-Acetaminophen] Itching    Can tolerate hydrocodone    Social History   Tobacco Use   Smoking status: Every Day    Packs/day: 0.30    Years: 14.00    Additional pack years: 0.00    Total pack years: 4.20    Types: Cigarettes    Last attempt to quit: 10/29/2019    Years since quitting: 2.5   Smokeless tobacco: Never   Tobacco comments:    1 pack every 3 days  Substance Use Topics   Alcohol use: Yes    Comment: socially    Family History  Problem Relation Age of Onset   Diabetes Father    Hypertension Father    Dementia Maternal Grandmother    Diabetes Maternal Grandfather    Diabetes Paternal Grandmother    Stroke Mother    Lung cancer Mother       Review of Systems  Constitutional: negative for fatigue and weight loss Respiratory: negative for cough and wheezing Cardiovascular: negative for chest pain, fatigue and palpitations Gastrointestinal: negative for abdominal pain and change in bowel habits Musculoskeletal:negative for myalgias Neurological: negative for gait problems and tremors Behavioral/Psych:  positive for depression.  negative for abusive relationship Endocrine: negative for temperature intolerance    Genitourinary: positive for vaginal discharge.  negative for abnormal menstrual periods, genital lesions, hot flashes, sexual problems  Integument/breast: negative for breast lump, breast tenderness, nipple discharge and skin lesion(s)    Objective:       BP 137/81   Pulse 64   Ht  (1.702 m)   Wt 270 lb (122.5 kg)   LMP 05/01/2022 (Exact  Date)   BMI 42.29 kg/m  General:   Alert and no distress  Skin:   no rash or abnormalities  Lungs:   clear to auscultation bilaterally  Heart:   regular rate and rhythm, S1, S2 normal, no murmur, click, rub or gallop  Breasts:   normal without suspicious masses, skin or nipple changes or axillary nodes  Abdomen:  normal findings: no organomegaly, soft, non-tender and no hernia  Pelvis:  External genitalia: normal general appearance Urinary system: urethral meatus normal and bladder without fullness, nontender Vaginal: normal without tenderness, induration or masses Cervix: normal appearance  Adnexa: normal bimanual exam Uterus: anteverted and non-tender, normal size   Lab Review Urine pregnancy test Labs reviewed yes Radiologic studies reviewed no  I have spent a total of 20 minutes of face-to-face time, excluding clinical staff time, reviewing notes and preparing to see patient, ordering tests and/or medications, and counseling the patient.   Assessment:    1. Encounter for gynecological examination with Papanicolaou smear of cervix Rx: - Cytology - PAP( [place])  2. Vaginal discharge Rx: - Cervicovaginal ancillary only( [place])  3. Screen for STD (sexually transmitted disease) Rx: - Hepatitis C Antibody - HIV antibody (with reflex) - RPR - Hepatitis B Surface AntiGEN  4. Current moderate episode of major depressive disorder without prior episode Rx: - Ambulatory referral to [facility]  5. Seasonal allergic rhinitis due to pollen Rx: - cetirizine (ZYRTEC ALLERGY) 10 MG tablet; Take 1 tablet (10 mg total) by mouth daily.  Dispense: 30 tablet; Refill: 11     Plan:    Education reviewed: calcium supplements, depression evaluation, low fat, low cholesterol diet, safe sex/STD prevention, self breast exams, and weight bearing exercise. Follow up in: 1 year.   Meds ordered this encounter  Medications   cetirizine (ZYRTEC ALLERGY) 10 MG  tablet    Sig: Take 1 tablet (10 mg total) by mouth daily.    Dispense:  30 tablet    Refill:  11   Orders Placed This Encounter  Procedures   Hepatitis C Antibody   HIV antibody (with reflex)   RPR   Hepatitis B Surface AntiGEN   Ambulatory referral to Integrated Behavioral Health    Referral Priority:   Routine    Referral Type:   Consultation    Referral Reason:   Specialty Services Required    Number of Visits Requested:   1    Brock Bad, MD 05/11/2022 12:00 PM

## 2022-05-11 NOTE — Progress Notes (Addendum)
37 y.o. GYN presents for AEX/PAP/STD screening.  C/o vaginal dryness/friction during sex.  PHQ-9=14 GAD-7=19

## 2022-05-12 LAB — HEPATITIS C ANTIBODY: Hep C Virus Ab: NONREACTIVE

## 2022-05-12 LAB — HEPATITIS B SURFACE ANTIGEN: Hepatitis B Surface Ag: NEGATIVE

## 2022-05-12 LAB — RPR: RPR Ser Ql: NONREACTIVE

## 2022-05-12 LAB — HIV ANTIBODY (ROUTINE TESTING W REFLEX): HIV Screen 4th Generation wRfx: NONREACTIVE

## 2022-05-14 ENCOUNTER — Other Ambulatory Visit: Payer: Self-pay | Admitting: Obstetrics

## 2022-05-14 DIAGNOSIS — B379 Candidiasis, unspecified: Secondary | ICD-10-CM

## 2022-05-14 LAB — CERVICOVAGINAL ANCILLARY ONLY
Bacterial Vaginitis (gardnerella): NEGATIVE
Candida Glabrata: POSITIVE — AB
Candida Vaginitis: NEGATIVE
Chlamydia: NEGATIVE
Comment: NEGATIVE
Comment: NEGATIVE
Comment: NEGATIVE
Comment: NEGATIVE
Comment: NEGATIVE
Comment: NORMAL
Neisseria Gonorrhea: NEGATIVE
Trichomonas: NEGATIVE

## 2022-05-14 MED ORDER — AZO BORIC ACID 600 MG VA SUPP: 1.0000 | Freq: Every day | VAGINAL | 0 refills | Status: DC

## 2022-05-17 ENCOUNTER — Ambulatory Visit: Payer: Medicaid Other | Admitting: Obstetrics and Gynecology

## 2022-05-17 ENCOUNTER — Institutional Professional Consult (permissible substitution): Payer: Medicaid Other | Admitting: Obstetrics and Gynecology

## 2022-05-17 ENCOUNTER — Encounter: Payer: Self-pay | Admitting: Obstetrics and Gynecology

## 2022-05-17 VITALS — BP 124/85 | HR 56 | Wt 269.0 lb

## 2022-05-17 DIAGNOSIS — N907 Vulvar cyst: Secondary | ICD-10-CM | POA: Insufficient documentation

## 2022-05-17 LAB — CYTOLOGY - PAP
Comment: NEGATIVE
Comment: NEGATIVE
Comment: NEGATIVE
Diagnosis: HIGH — AB
HPV 16: NEGATIVE
HPV 18 / 45: NEGATIVE
High risk HPV: POSITIVE — AB

## 2022-05-17 NOTE — Progress Notes (Signed)
Pt is in office to discuss vaginal cyst, ?removal. Pt has had them for a few years.  Recently seen by Dr Clearance Coots, then referred to [facility].

## 2022-05-17 NOTE — Progress Notes (Signed)
Ms Penny presents in referral from Dr. Clearance Coots for eval of excision of several sebaceous cysts on her  labia. Have been there for several yrs and now she would like to have removed.  PE AF VSS Chaperone present  Lungs clear  Heart RRR Abd soft + BS GU several sebaceous cysts noted, ranging sizes  A/P Labial sebaceous cyst  Discussed excision with pt. She desires. Discussed excision in the office vs surgery center. R/B of each reviewed. Following discussion pt desires to have performed in surgery center Will schedule and follow up with post op appt

## 2022-05-21 ENCOUNTER — Ambulatory Visit: Payer: Self-pay | Admitting: Licensed Clinical Social Worker

## 2022-05-24 ENCOUNTER — Ambulatory Visit: Payer: Self-pay | Admitting: Licensed Clinical Social Worker

## 2022-06-01 NOTE — H&P (Signed)
Lisa Wright is an 37 y.o. female with vulvar sebaceous cyst. Desires removal   Menstrual History: Menarche age: 57 Patient's last menstrual period was 05/01/2022 (exact date).    Past Medical History:  Diagnosis Date   Cervical cancer (HCC) 2016   Cesarean delivery delivered 11/04/2019   Chlamydia    Migraines    Trichomonas infection    Vaginal Pap smear, abnormal     Past Surgical History:  Procedure Laterality Date   CESAREAN SECTION     CESAREAN SECTION     CESAREAN SECTION N/A 11/04/2019   Procedure: CESAREAN SECTION;  Surgeon: Catalina Antigua, MD;  Location: [facility] LD ORS;  Service: Obstetrics;  Laterality: N/A;   COLPOSCOPY      Family History  Problem Relation Age of Onset   Diabetes Father    Hypertension Father    Dementia Maternal Grandmother    Diabetes Maternal Grandfather    Diabetes Paternal Grandmother    Stroke Mother    Lung cancer Mother     Social History:  reports that she has been smoking cigarettes. She has a 4.20 pack-year smoking history. She has never used smokeless tobacco. She reports current alcohol use. She reports that she does not use drugs.  Allergies:  Allergies  Allergen Reactions   Ibuprofen Nausea And Vomiting   Citalopram     Felt like a zombie    Percocet [Oxycodone-Acetaminophen] Itching    Can tolerate hydrocodone    No medications prior to admission.    Review of Systems  Constitutional: Negative.   Respiratory: Negative.    Cardiovascular: Negative.   Gastrointestinal: Negative.   Genitourinary: Negative.     Last menstrual period 05/01/2022, unknown if currently breastfeeding. Physical Exam Constitutional:      Appearance: Normal appearance.  Cardiovascular:     Rate and Rhythm: Normal rate and regular rhythm.  Pulmonary:     Effort: Pulmonary effort is normal.     Breath sounds: Normal breath sounds.  Abdominal:     General: Bowel sounds are normal.     Palpations: Abdomen is soft.  Genitourinary:     Comments: Nl EGBUS except for various sizes of sebaceous cyst Neurological:     Mental Status: She is alert.     No results found for this or any previous visit (from the past 24 hour(s)).  No results found.  Assessment/Plan: Vulvar Sebaceous cyst  Pt desires removal of sebaceous. R/B/Post op care reviewed. Pt desires to proceed.   [name] 06/01/2022, 11:14 AM

## 2022-06-04 ENCOUNTER — Encounter (HOSPITAL_COMMUNITY): Payer: Self-pay | Admitting: Obstetrics and Gynecology

## 2022-06-04 ENCOUNTER — Other Ambulatory Visit: Payer: Self-pay

## 2022-06-04 NOTE — Pre-Procedure Instructions (Signed)
PCP - Kennon Holter, FNP [facility]  Anesthesia review: N  Patient verbally denies any shortness of breath, fever, cough and chest pain during phone call   -------------  SDW INSTRUCTIONS given:  Your procedure is scheduled on Wednesday, Jun 04, 2022.  Report to [facility] Main Entrance "A" at 0530 A.M., and check in at the Admitting office.  Call this number if you have problems the morning of surgery:  [phone]   Remember:  Do not eat after midnight the night before your surgery  You may drink clear liquids until 0500 the morning of your surgery.   Clear liquids allowed are: Water, Non-Citrus Juices (without pulp), Carbonated Beverages, Clear Tea, Black Coffee Only, and Gatorade    Take these medicines the morning of surgery with A SIP OF WATER  buPROPion (WELLBUTRIN XL)  cetirizine (ZYRTEC ALLERGY)  pantoprazole (PROTONIX)  acetaminophen-codeine (TYLENOL #2)-if needed  As of today, STOP taking any Aspirin (unless otherwise instructed by your surgeon) Aleve, Naproxen, Ibuprofen, Motrin, Advil, Goody's, BC's, all herbal medications, fish oil, and all vitamins.                      Do not wear jewelry, make up, or nail polish            Do not wear lotions, powders, perfumes/colognes, or deodorant.            Do not shave 48 hours prior to surgery.  Men may shave face and neck.            Do not bring valuables to the hospital.            [facility] is not responsible for any belongings or valuables.  Do NOT Smoke (Tobacco/Vaping) 24 hours prior to your procedure If you use a CPAP at night, you may bring all equipment for your overnight stay.   Contacts, glasses, dentures or bridgework may not be worn into surgery.      For patients admitted to the hospital, discharge time will be determined by your treatment team.   Patients discharged the day of surgery will not be allowed to drive home, and someone needs to stay with them for 24 hours.    Special  instructions:   [place]- Preparing For Surgery  Before surgery, you can play an important role. Because skin is not sterile, your skin needs to be as free of germs as possible. You can reduce the number of germs on your skin by washing with CHG (chlorahexidine gluconate) Soap before surgery.  CHG is an antiseptic cleaner which kills germs and bonds with the skin to continue killing germs even after washing.    Oral Hygiene is also important to reduce your risk of infection.  Remember - BRUSH YOUR TEETH THE MORNING OF SURGERY WITH YOUR REGULAR TOOTHPASTE  Please do not use if you have an allergy to CHG or antibacterial soaps. If your skin becomes reddened/irritated stop using the CHG.  Do not shave (including legs and underarms) for at least 48 hours prior to first CHG shower. It is OK to shave your face.  Please follow these instructions carefully.   Shower the NIGHT BEFORE SURGERY and the MORNING OF SURGERY with DIAL Soap.   Pat yourself dry with a CLEAN TOWEL.  Wear CLEAN PAJAMAS to bed the night before surgery  Place CLEAN SHEETS on your bed the night of your first shower and DO NOT SLEEP WITH PETS.   Day  of Surgery: Please shower morning of surgery  Wear Clean/Comfortable clothing the morning of surgery Do not apply any deodorants/lotions.   Remember to brush your teeth WITH YOUR REGULAR TOOTHPASTE.   Questions were answered. Patient verbalized understanding of instructions.

## 2022-06-05 ENCOUNTER — Other Ambulatory Visit: Payer: Self-pay

## 2022-06-05 ENCOUNTER — Ambulatory Visit (HOSPITAL_COMMUNITY)
Admission: RE | Admit: 2022-06-05 | Discharge: 2022-06-05 | Disposition: A | Discharge status: Home / Self Care | Payer: Medicaid Other | Attending: Obstetrics and Gynecology | Admitting: Obstetrics and Gynecology

## 2022-06-05 ENCOUNTER — Encounter (HOSPITAL_COMMUNITY): Payer: Self-pay | Admitting: Obstetrics and Gynecology

## 2022-06-05 ENCOUNTER — Encounter (HOSPITAL_COMMUNITY)
Admission: RE | Disposition: A | Discharge status: Home / Self Care | Payer: Self-pay | Source: Home / Self Care | Attending: Obstetrics and Gynecology

## 2022-06-05 ENCOUNTER — Ambulatory Visit (HOSPITAL_COMMUNITY): Payer: Medicaid Other | Admitting: Certified Registered Nurse Anesthetist

## 2022-06-05 ENCOUNTER — Ambulatory Visit (HOSPITAL_BASED_OUTPATIENT_CLINIC_OR_DEPARTMENT_OTHER): Payer: Medicaid Other | Admitting: Certified Registered Nurse Anesthetist

## 2022-06-05 DIAGNOSIS — F17210 Nicotine dependence, cigarettes, uncomplicated: Secondary | ICD-10-CM

## 2022-06-05 DIAGNOSIS — N907 Vulvar cyst: Secondary | ICD-10-CM

## 2022-06-05 DIAGNOSIS — F419 Anxiety disorder, unspecified: Secondary | ICD-10-CM | POA: Diagnosis not present

## 2022-06-05 DIAGNOSIS — K219 Gastro-esophageal reflux disease without esophagitis: Secondary | ICD-10-CM | POA: Insufficient documentation

## 2022-06-05 DIAGNOSIS — Z8541 Personal history of malignant neoplasm of cervix uteri: Secondary | ICD-10-CM | POA: Diagnosis not present

## 2022-06-05 DIAGNOSIS — Z6841 Body Mass Index (BMI) 40.0 and over, adult: Secondary | ICD-10-CM | POA: Insufficient documentation

## 2022-06-05 DIAGNOSIS — F319 Bipolar disorder, unspecified: Secondary | ICD-10-CM | POA: Insufficient documentation

## 2022-06-05 DIAGNOSIS — F17200 Nicotine dependence, unspecified, uncomplicated: Secondary | ICD-10-CM | POA: Diagnosis not present

## 2022-06-05 HISTORY — DX: Depression, unspecified: F32.A

## 2022-06-05 HISTORY — DX: Gastro-esophageal reflux disease without esophagitis: K21.9

## 2022-06-05 HISTORY — PX: IRRIGATION AND DEBRIDEMENT SEBACEOUS CYST: SHX5255

## 2022-06-05 HISTORY — DX: Bipolar disorder, unspecified: F31.9

## 2022-06-05 HISTORY — DX: COVID-19: U07.1

## 2022-06-05 HISTORY — DX: Anxiety disorder, unspecified: F41.9

## 2022-06-05 LAB — CBC
HCT: 37.3 % (ref 36.0–46.0)
Hemoglobin: 11.9 g/dL — ABNORMAL LOW (ref 12.0–15.0)
MCH: 29.2 pg (ref 26.0–34.0)
MCHC: 31.9 g/dL (ref 30.0–36.0)
MCV: 91.4 fL (ref 80.0–100.0)
Platelets: 338 10*3/uL (ref 150–400)
RBC: 4.08 MIL/uL (ref 3.87–5.11)
RDW: 13.4 % (ref 11.5–15.5)
WBC: 9.9 10*3/uL (ref 4.0–10.5)
nRBC: 0 % (ref 0.0–0.2)

## 2022-06-05 LAB — POCT PREGNANCY, URINE: Preg Test, Ur: NEGATIVE

## 2022-06-05 SURGERY — IRRIGATION AND DEBRIDEMENT SEBACEOUS CYST
Anesthesia: General | Site: Perineum

## 2022-06-05 MED ORDER — LIDOCAINE 2% (20 MG/ML) 5 ML SYRINGE
INTRAMUSCULAR | Status: DC | PRN
  Administered 2022-06-05: 100 mg via INTRAVENOUS

## 2022-06-05 MED ORDER — LIDOCAINE 2% (20 MG/ML) 5 ML SYRINGE
INTRAMUSCULAR | Status: AC
  Filled 2022-06-05: qty 5

## 2022-06-05 MED ORDER — MIDAZOLAM HCL 2 MG/2ML IJ SOLN
INTRAMUSCULAR | Status: DC | PRN
  Administered 2022-06-05 (×2): 1 mg via INTRAVENOUS

## 2022-06-05 MED ORDER — KETOROLAC TROMETHAMINE 15 MG/ML IJ SOLN
30.0000 mg | INTRAMUSCULAR | Status: AC
  Administered 2022-06-05: 30 mg via INTRAVENOUS
  Filled 2022-06-05: qty 1
  Filled 2022-06-05: qty 2

## 2022-06-05 MED ORDER — LACTATED RINGERS IV SOLN: INTRAVENOUS | Status: DC

## 2022-06-05 MED ORDER — FENTANYL CITRATE (PF) 100 MCG/2ML IJ SOLN: 25.0000 ug | INTRAMUSCULAR | Status: DC | PRN

## 2022-06-05 MED ORDER — PROPOFOL 10 MG/ML IV BOLUS
INTRAVENOUS | Status: AC
  Filled 2022-06-05: qty 20

## 2022-06-05 MED ORDER — ORAL CARE MOUTH RINSE: 15.0000 mL | Freq: Once | OROMUCOSAL | Status: AC

## 2022-06-05 MED ORDER — IBUPROFEN 800 MG PO TABS
800.0000 mg | ORAL_TABLET | Freq: Three times a day (TID) | ORAL | 0 refills | Status: DC | PRN
Start: 2022-06-05 — End: 2022-09-06

## 2022-06-05 MED ORDER — ACETAMINOPHEN 500 MG PO TABS
ORAL_TABLET | ORAL | Status: AC
  Filled 2022-06-05: qty 2

## 2022-06-05 MED ORDER — HYDROCODONE-ACETAMINOPHEN 5-325 MG PO TABS
1.0000 | ORAL_TABLET | Freq: Four times a day (QID) | ORAL | 0 refills | Status: DC | PRN
Start: 2022-06-05 — End: 2022-09-06

## 2022-06-05 MED ORDER — LIDOCAINE HCL (PF) 1 % IJ SOLN
INTRAMUSCULAR | Status: AC
  Filled 2022-06-05: qty 30

## 2022-06-05 MED ORDER — ONDANSETRON HCL 4 MG/2ML IJ SOLN
INTRAMUSCULAR | Status: DC | PRN
  Administered 2022-06-05: 4 mg via INTRAVENOUS

## 2022-06-05 MED ORDER — ACETAMINOPHEN 500 MG PO TABS
1000.0000 mg | ORAL_TABLET | ORAL | Status: AC
  Administered 2022-06-05: 1000 mg via ORAL
  Filled 2022-06-05: qty 2

## 2022-06-05 MED ORDER — CHLORHEXIDINE GLUCONATE 0.12 % MT SOLN
15.0000 mL | Freq: Once | OROMUCOSAL | Status: AC
  Administered 2022-06-05: 15 mL via OROMUCOSAL
  Filled 2022-06-05: qty 15

## 2022-06-05 MED ORDER — TRIPLE ANTIBIOTIC 3.5-400-5000 EX OINT
TOPICAL_OINTMENT | CUTANEOUS | Status: DC | PRN
  Administered 2022-06-05: 1 via TOPICAL

## 2022-06-05 MED ORDER — DEXMEDETOMIDINE HCL IN NACL 80 MCG/20ML IV SOLN
INTRAVENOUS | Status: DC | PRN
  Administered 2022-06-05: 20 ug via INTRAVENOUS

## 2022-06-05 MED ORDER — PROPOFOL 10 MG/ML IV BOLUS
INTRAVENOUS | Status: DC | PRN
  Administered 2022-06-05: 80 mg via INTRAVENOUS
  Administered 2022-06-05: 200 mg via INTRAVENOUS

## 2022-06-05 MED ORDER — MIDAZOLAM HCL 2 MG/2ML IJ SOLN
INTRAMUSCULAR | Status: AC
  Filled 2022-06-05: qty 2

## 2022-06-05 MED ORDER — ONDANSETRON HCL 4 MG/2ML IJ SOLN
INTRAMUSCULAR | Status: AC
  Filled 2022-06-05: qty 2

## 2022-06-05 MED ORDER — POVIDONE-IODINE 10 % EX SWAB: 2.0000 | Freq: Once | CUTANEOUS | Status: DC

## 2022-06-05 MED ORDER — BACITRACIN-NEOMYCIN-POLYMYXIN OINTMENT TUBE
TOPICAL_OINTMENT | CUTANEOUS | Status: AC
  Filled 2022-06-05: qty 14.17

## 2022-06-05 MED ORDER — DEXMEDETOMIDINE HCL IN NACL 80 MCG/20ML IV SOLN
INTRAVENOUS | Status: AC
  Filled 2022-06-05: qty 20

## 2022-06-05 MED ORDER — SOD CITRATE-CITRIC ACID 500-334 MG/5ML PO SOLN
30.0000 mL | ORAL | Status: AC
  Administered 2022-06-05: 30 mL via ORAL
  Filled 2022-06-05: qty 30

## 2022-06-05 SURGICAL SUPPLY — 16 items
BLADE SURG 11 STRL SS (BLADE) ×1 IMPLANT
ELECT NDL BLADE 2-5/6 (NEEDLE) IMPLANT
ELECT NEEDLE BLADE 2-5/6 (NEEDLE) ×2 IMPLANT
ELECT REM PT RETURN 9FT ADLT (ELECTROSURGICAL) ×2
ELECTRODE REM PT RTRN 9FT ADLT (ELECTROSURGICAL) ×1 IMPLANT
GLOVE SURG ENC MOIS LTX SZ7 (GLOVE) ×4 IMPLANT
GOWN STRL REUS W/ TWL LRG LVL3 (GOWN DISPOSABLE) ×3 IMPLANT
GOWN STRL REUS W/TWL LRG LVL3 (GOWN DISPOSABLE) ×3 IMPLANT
NS IRRIG 1000ML POUR BTL (IV SOLUTION) ×2 IMPLANT
PACK VAGINAL MINOR WOMEN LF (CUSTOM PROCEDURE TRAY) ×2 IMPLANT
PAD OB MATERNITY 4.3X12.25 (PERSONAL CARE ITEMS) ×2 IMPLANT
SUT VIC AB 2-0 SH 27 (SUTURE) ×2
SUT VIC AB 2-0 SH 27XBRD (SUTURE) ×1 IMPLANT
TOWEL GREEN STERILE FF (TOWEL DISPOSABLE) ×3 IMPLANT
TUBE CONNECTING 20X1/4 (TUBING) ×1 IMPLANT
YANKAUER SUCT BULB TIP NO VENT (SUCTIONS) ×1 IMPLANT

## 2022-06-05 NOTE — Interval H&P Note (Signed)
History and Physical Interval Note:  06/05/2022 7:43 AM  Lisa Wright  has presented today for surgery, with the diagnosis of Labial Cyst.  The various methods of treatment have been discussed with the patient and family. After consideration of risks, benefits and other options for treatment, the patient has consented to  Procedure(s): VULVAR BIOPSY (N/A) as a surgical intervention.  The patient's history has been reviewed, patient examined, no change in status, stable for surgery.  I have reviewed the patient's chart and labs.  Questions were answered to the patient's satisfaction.     [name]

## 2022-06-05 NOTE — Transfer of Care (Signed)
Immediate Anesthesia Transfer of Care Note  Patient: Lisa Wright  Procedure(s) Performed: IRRIGATION AND DEBRIDEMENT LABIAL SEBACEOUS CYSTS (Perineum)  Patient Location: PACU  Anesthesia Type:General  Level of Consciousness: awake, alert , and oriented  Airway & Oxygen Therapy: Patient Spontanous Breathing and Patient connected to face mask oxygen  Post-op Assessment: Report given to RN, Post -op Vital signs reviewed and stable, Patient moving all extremities X 4, and Patient able to stick tongue midline  Post vital signs: Reviewed  Last Vitals:  Vitals Value Taken Time  BP 93/75 06/05/22 0856  Temp 97.8   Pulse 70 06/05/22 0858  Resp 16 06/05/22 0858  SpO2 97 % 06/05/22 0858  Vitals shown include unvalidated device data.  Last Pain:  Vitals:   06/05/22 0704  TempSrc:   PainSc: 0-No pain         Complications: No notable events documented.

## 2022-06-05 NOTE — Anesthesia Postprocedure Evaluation (Signed)
Anesthesia Post Note  Patient: Lisa Wright  Procedure(s) Performed: IRRIGATION AND DEBRIDEMENT LABIAL SEBACEOUS CYSTS (Perineum)     Patient location during evaluation: PACU Anesthesia Type: General Level of consciousness: awake and alert Pain management: pain level controlled Vital Signs Assessment: post-procedure vital signs reviewed and stable Respiratory status: spontaneous breathing, nonlabored ventilation, respiratory function stable and patient connected to nasal cannula oxygen Cardiovascular status: blood pressure returned to baseline and stable Postop Assessment: no apparent nausea or vomiting Anesthetic complications: no   No notable events documented.  Last Vitals:  Vitals:   06/05/22 0915 06/05/22 0930  BP: 109/70 117/77  Pulse: 60 (!) 53  Resp: 17 13  Temp: 36.6 C 36.6 C  SpO2: 95% 95%    Last Pain:  Vitals:   06/05/22 0915  TempSrc:   PainSc: 0-No pain                 Earl Lites P Elner Seifert

## 2022-06-05 NOTE — Anesthesia Procedure Notes (Signed)
Procedure Name: LMA Insertion Date/Time: 06/05/2022 8:09 AM  Performed by: Cy Blamer, CRNAPre-anesthesia Checklist: Patient identified, Emergency Drugs available, Suction available, Patient being monitored and Timeout performed Patient Re-evaluated:Patient Re-evaluated prior to induction Oxygen Delivery Method: Circle system utilized Preoxygenation: Pre-oxygenation with 100% oxygen Induction Type: IV induction LMA: LMA inserted LMA Size: 4.0 Number of attempts: 1 Placement Confirmation: positive ETCO2 and breath sounds checked- equal and bilateral Tube secured with: Tape Dental Injury: Teeth and Oropharynx as per pre-operative assessment

## 2022-06-05 NOTE — Op Note (Signed)
Lisa Wright PROCEDURE DATE: 06/05/2022  PREOPERATIVE DIAGNOSIS: Labial Cysts POSTOPERATIVE DIAGNOSIS: The same PROCEDURE:   Incision and Draining of sebaceous cysts SURGEON:  Dr. Casimiro Needle L. Avaya Mcjunkins  INDICATIONS: 37 y.o. [id number] with numerous labial sebaceous cyst @ 15, who desires I & D.  Risks of surgery were discussed with the patient including but not limited to: bleeding which may require transfusion; infection which may require antibiotics; injury to uterus or surrounding organs; need for additional procedures including laparotomy or laparoscopy; possibility of intrauterine scarring which may impair future fertility; and other postoperative/anesthesia complications. Written informed consent was obtained.    FINDINGS:  15 labial sebaceous cysts on the labia  ANESTHESIA:    GETA INTRAVENOUS FLUIDS:  As recorded ESTIMATED BLOOD LOSS: 3 cc SPECIMENS:  None COMPLICATIONS:  None immediate.  PROCEDURE DETAILS:  The patient was taken to the operating room where she underwent GETA without problems.  After an adequate timeout was performed, she was placed in the dorsal lithotomy position and examined; then prepped and draped in the sterile manner.   Her bladder was catheterized for an unmeasured amount of clear, yellow urine. Examination of the labial revealed various sizes of sebaceous cysts. Each cysts was open and sebaceous material was expressed. The cyst wall of each was grasped and remove. Bovie was used to achieve hemostasis.  Antibiotic ointment was applied to the labial. The procedure was then completed.   All instruments were removed from the patient's vagina.  Sponge and instrument counts were correct times two  The patient tolerated the procedure well and was taken to the recovery area awake, and in stable condition.  The patient will be discharged to home as per PACU criteria.  Routine postoperative instructions given.   She will follow up in the clinic  for postoperative  evaluation.   [name], MD, FACOG Attending Obstetrician & Gynecologist Faculty Practice, [facility]

## 2022-06-05 NOTE — Anesthesia Preprocedure Evaluation (Signed)
Anesthesia Evaluation  Patient identified by MRN, date of birth, ID band Patient awake    Reviewed: Allergy & Precautions, NPO status , Patient's Chart, lab work & pertinent test results  Airway Mallampati: II  TM Distance: >3 FB Neck ROM: Full    Dental no notable dental hx.    Pulmonary Current Smoker   Pulmonary exam normal        Cardiovascular negative cardio ROS  Rhythm:Regular Rate:Normal     Neuro/Psych  Headaches  Anxiety Depression Bipolar Disorder      GI/Hepatic Neg liver ROS,GERD  Medicated,,  Endo/Other    Morbid obesity  Renal/GU negative Renal ROS  Female GU complaint     Musculoskeletal negative musculoskeletal ROS (+)    Abdominal Normal abdominal exam  (+)   Peds  Hematology negative hematology ROS (+)   Anesthesia Other Findings   Reproductive/Obstetrics                             Anesthesia Physical Anesthesia Plan  ASA: 3  Anesthesia Plan: General   Post-op Pain Management:    Induction: Intravenous  PONV Risk Score and Plan: 2 and Ondansetron, Dexamethasone, Midazolam and Treatment may vary due to age or medical condition  Airway Management Planned: Mask and LMA  Additional Equipment: None  Intra-op Plan:   Post-operative Plan: Extubation in OR  Informed Consent: I have reviewed the patients History and Physical, chart, labs and discussed the procedure including the risks, benefits and alternatives for the proposed anesthesia with the patient or authorized representative who has indicated his/her understanding and acceptance.     Dental advisory given  Plan Discussed with: CRNA  Anesthesia Plan Comments: (Lab Results      Component                Value               Date                      WBC                      9.3                 11/05/2019                HGB                      9.8 (L)             11/05/2019                HCT                       29.5 (L)            11/05/2019                MCV                      93.9                11/05/2019                PLT                      185  11/05/2019           )       Anesthesia Quick Evaluation

## 2022-06-06 ENCOUNTER — Encounter (HOSPITAL_COMMUNITY): Payer: Self-pay | Admitting: Obstetrics and Gynecology

## 2022-06-06 ENCOUNTER — Encounter: Payer: Self-pay | Admitting: Obstetrics and Gynecology

## 2022-06-06 ENCOUNTER — Other Ambulatory Visit: Payer: Self-pay | Admitting: Obstetrics and Gynecology

## 2022-06-06 DIAGNOSIS — N907 Vulvar cyst: Secondary | ICD-10-CM

## 2022-06-06 MED ORDER — ACETAMINOPHEN-CODEINE 300-30 MG PO TABS
1.0000 | ORAL_TABLET | ORAL | 0 refills | Status: DC | PRN
Start: 2022-06-06 — End: 2022-09-06

## 2022-06-12 ENCOUNTER — Encounter: Payer: Medicaid Other | Admitting: Obstetrics and Gynecology

## 2022-06-21 ENCOUNTER — Encounter: Payer: Medicaid Other | Admitting: Obstetrics and Gynecology

## 2022-07-10 ENCOUNTER — Ambulatory Visit (INDEPENDENT_AMBULATORY_CARE_PROVIDER_SITE_OTHER): Payer: Medicaid Other | Admitting: Obstetrics and Gynecology

## 2022-07-10 ENCOUNTER — Encounter: Payer: Self-pay | Admitting: Obstetrics and Gynecology

## 2022-07-10 VITALS — BP 113/77 | HR 80 | Wt 266.0 lb

## 2022-07-10 DIAGNOSIS — Z9889 Other specified postprocedural states: Secondary | ICD-10-CM | POA: Insufficient documentation

## 2022-07-10 DIAGNOSIS — R87613 High grade squamous intraepithelial lesion on cytologic smear of cervix (HGSIL): Secondary | ICD-10-CM

## 2022-07-10 IMAGING — US US MFM OB FOLLOW-UP
1 series · 13 of 28 positions shown · non-contrast
Comparison: none

[Series 1: us mfm ob follow-up · 13 of 50 slices shown]
[im 2/50]
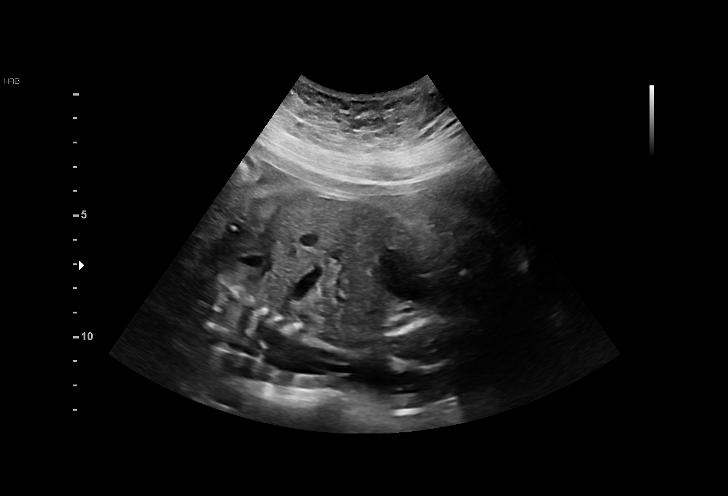
[im 6/50]
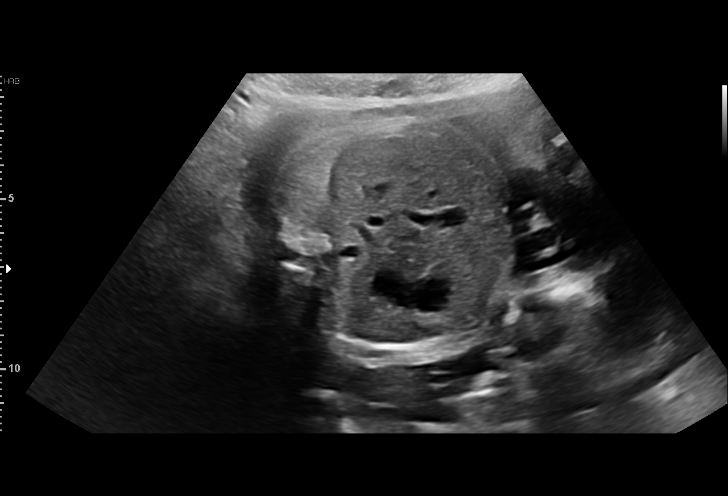
[im 10/50]
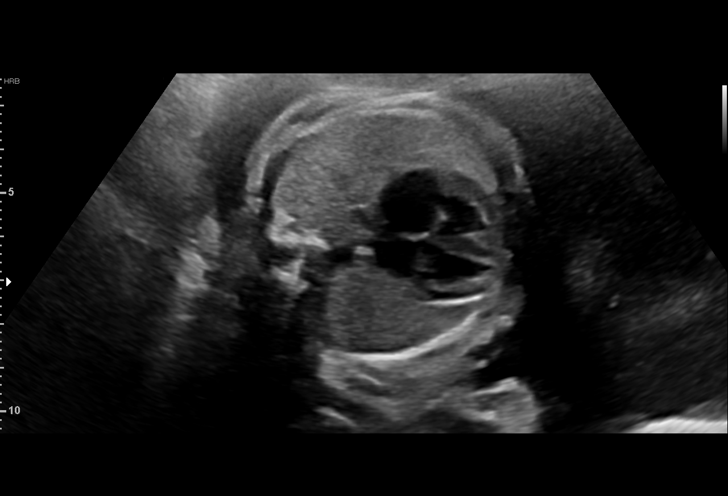
[im 13/50]
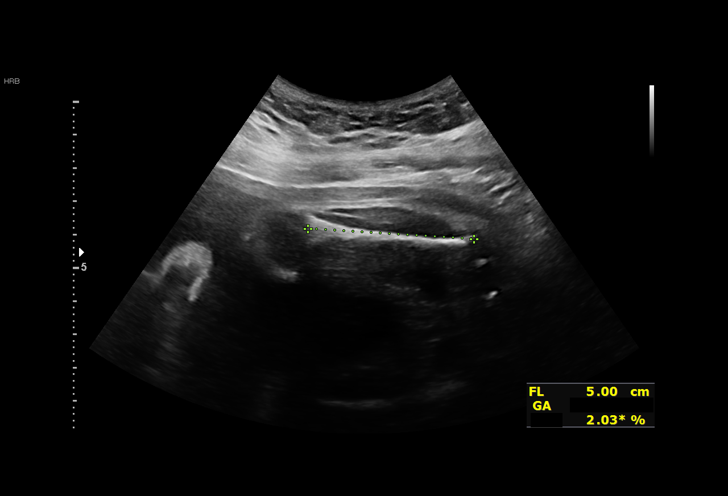
[im 17/50]
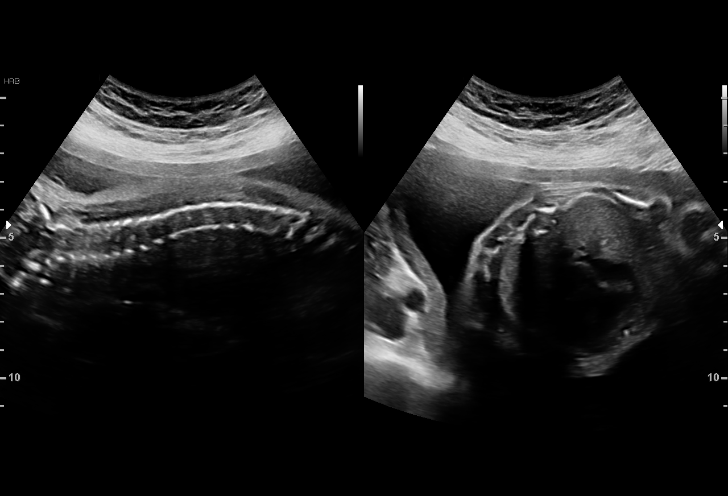
[im 20/50]
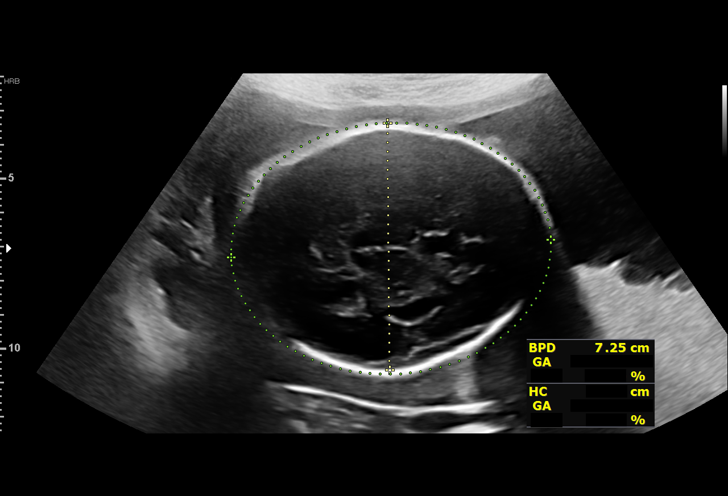
[im 26/50]
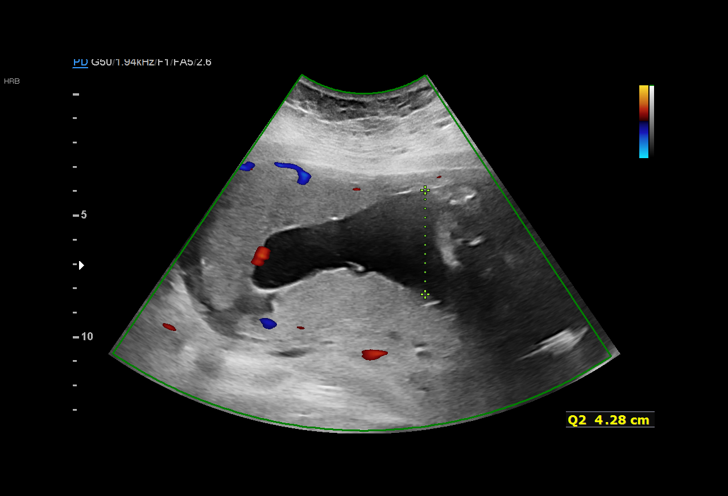
[im 30/50]
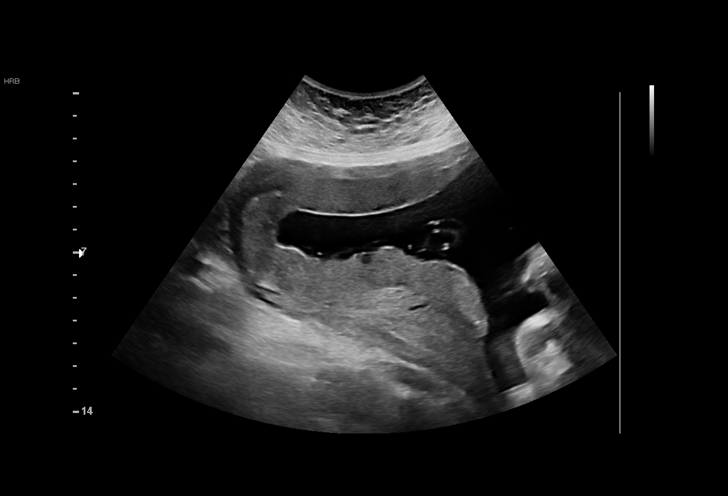
[im 33/50]
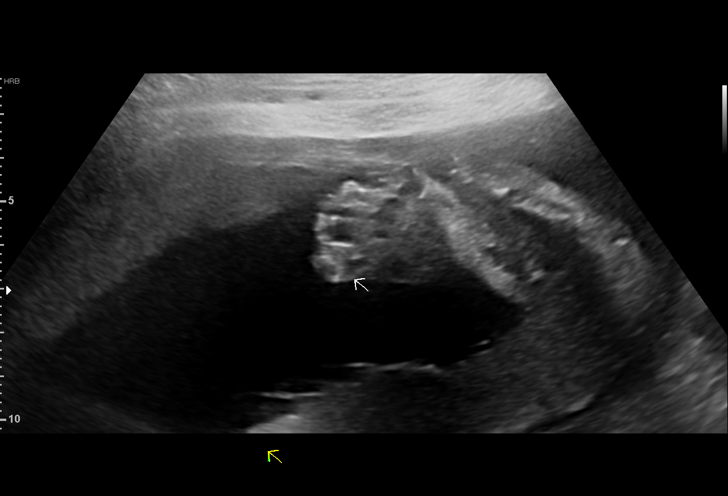
[im 37/50]
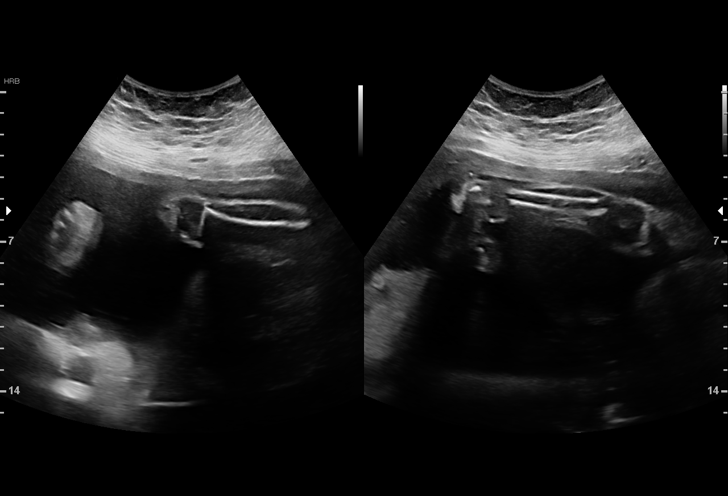
[im 40/50]
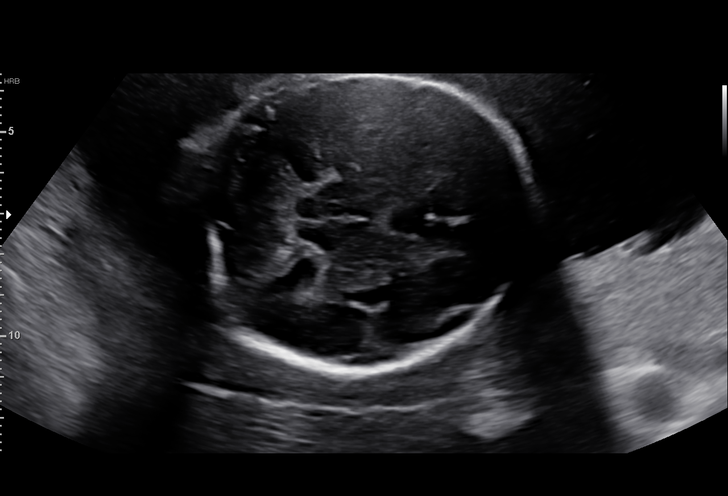
[im 44/50]
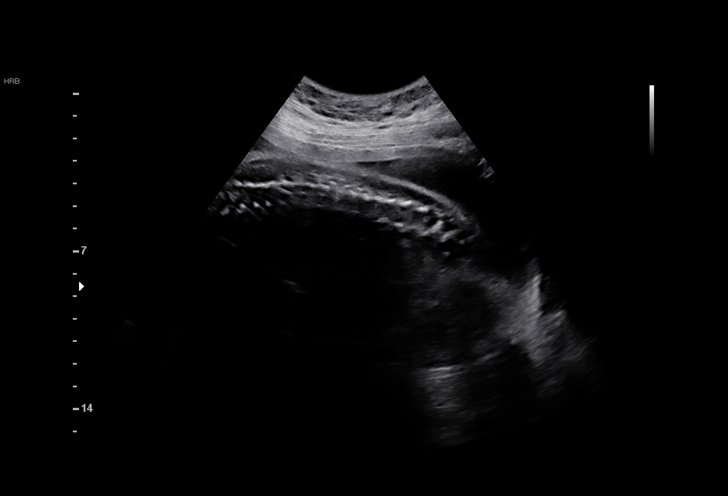
[im 48/50]
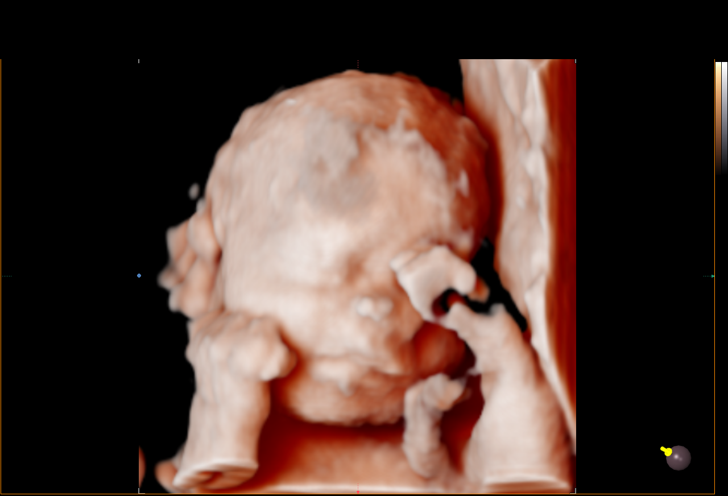

[13 of 28 positions shown; findings below may reference images not displayed]

[REDACTED]
                   SERAPIO CNM

                                                      DIMOSTHENIS

Indications

 Previous cesarean delivery, antepartum x 2
 Antenatal follow-up for nonvisualized fetal
 anatomy (LR Nips, Negative AFP)
 29 weeks gestation of pregnancy
Fetal Evaluation

 Num Of Fetuses:         1
 Fetal Heart Rate(bpm):  153
 Cardiac Activity:       Observed
 Presentation:           Breech
 Placenta:               Fundal
 P. Cord Insertion:      Previously Visualized

 Amniotic Fluid
 AFI FV:      Within normal limits

 AFI Sum(cm)     %Tile       Largest Pocket(cm)
 20.16           80

 RUQ(cm)       RLQ(cm)       LUQ(cm)        LLQ(cm)

Biometry
 BPD:      72.3  mm     G. Age:  29w 0d         38  %    CI:        73.91   %    70 - 86
                                                         FL/HC:      18.8   %    19.6 -
 HC:      267.1  mm     G. Age:  29w 1d         20  %    HC/AC:      1.11        0.99 -
 AC:      241.4  mm     G. Age:  28w 3d         26  %    FL/BPD:     69.3   %    71 - 87
 FL:       50.1  mm     G. Age:  27w 0d        2.2  %    FL/AC:      20.8   %    20 - 24
 LV:        1.8  mm

 Est. FW:    8894  gm      2 lb 9 oz     11  %
OB History

 Gravidity:    5         Term:   3        Prem:   0        SAB:   0
 TOP:          1       Ectopic:  0        Living: 3
Gestational Age

 LMP:           29w 6d        Date:  01/29/19                 EDD:   11/05/19
 U/S Today:     28w 3d                                        EDD:   11/15/19
 Best:          29w 0d     Det. By:  Early Ultrasound         EDD:   11/11/19
                                     (04/07/19)
Anatomy

 Cranium:               Appears normal         LVOT:                   Previously seen
 Cavum:                 Appears normal         Aortic Arch:            Previously seen
 Ventricles:            Appears normal         Ductal Arch:            Previously seen
 Choroid Plexus:        Previously seen        Diaphragm:              Appears normal
 Cerebellum:            Previously seen        Stomach:                Appears normal, left
                                                                       sided
 Posterior Fossa:       Previously seen        Abdomen:                Appears normal
 Nuchal Fold:           Previously seen        Abdominal Wall:         Previously seen
 Face:                  Orbits and profile     Cord Vessels:           Previously seen
                        previously seen
 Lips:                  Previously seen        Kidneys:                Appear normal
 Palate:                Previously seen        Bladder:                Appears normal
 Thoracic:              Appears normal         Spine:                  Not well visualized
 Heart:                 Appears normal         Upper Extremities:      Appears normal
                        (4CH, axis, and
                        situs)
 RVOT:                  Previously seen        Lower Extremities:      Appears normal

 Other:  Fetus appears to be a male. Heels previously visualized. Nasal bone
         previously visualized.
Cervix Uterus Adnexa

 Cervix
 Not visualized (advanced GA >41wks)
Comments

 This patient was seen for a follow up exam as the views of
 the fetal spine were unable to be fully visualized during her
 prior ultrasound exam.  She denies any problems since her
 last exam.  She has not been screened for gestational
 diabetes yet
 She was informed that the fetal growth and amniotic fluid
 level appears appropriate for her gestational age.  The overall
 EFW measured at the 11th percentile for her gestational age.
 The views of the fetal spine remain limited.  However, there
 were no obvious anomalies suspected.
 Due to the lower estimated fetal weight noted today, a follow-
 up growth scan was scheduled in 4 weeks

## 2022-07-10 NOTE — Progress Notes (Signed)
Lisa Wright presents for post op visit. S/P sebaceous cyst removal on 06/05/22. Pt without complaints.  Denies any bowel or bladder dysfunction  PE AF VSS Chaperone present  Lungs clear Heart RRR Abd soft + BS GU Nl EGBUS, surgerical sites healed well.  A/P Post op        Abnormal pap smear  Return to nl ADL's as tolerates. To schedule colpo.

## 2022-08-09 ENCOUNTER — Telehealth: Payer: Medicaid Other | Admitting: Physician Assistant

## 2022-08-09 DIAGNOSIS — N760 Acute vaginitis: Secondary | ICD-10-CM

## 2022-08-09 DIAGNOSIS — B9689 Other specified bacterial agents as the cause of diseases classified elsewhere: Secondary | ICD-10-CM

## 2022-08-09 MED ORDER — FLUCONAZOLE 150 MG PO TABS: ORAL_TABLET | ORAL | 0 refills | Status: DC

## 2022-08-09 MED ORDER — METRONIDAZOLE 500 MG PO TABS: 500.0000 mg | ORAL_TABLET | Freq: Two times a day (BID) | ORAL | 0 refills | Status: DC

## 2022-08-09 NOTE — Progress Notes (Signed)

## 2022-08-09 NOTE — Progress Notes (Signed)
I have spent 5 minutes in review of e-visit questionnaire, review and updating patient chart, medical decision making and response to patient.   William Cody Martin, PA-C    

## 2022-09-06 ENCOUNTER — Ambulatory Visit: Payer: Medicaid Other | Admitting: Obstetrics and Gynecology

## 2022-09-06 ENCOUNTER — Encounter: Payer: Self-pay | Admitting: Obstetrics and Gynecology

## 2022-09-06 ENCOUNTER — Other Ambulatory Visit (HOSPITAL_COMMUNITY)
Admission: RE | Admit: 2022-09-06 | Discharge: 2022-09-06 | Disposition: A | Discharge status: Home / Self Care | Payer: Medicaid Other | Source: Ambulatory Visit | Attending: Obstetrics and Gynecology | Admitting: Obstetrics and Gynecology

## 2022-09-06 VITALS — BP 107/70 | HR 65 | Ht 67.0 in | Wt 262.0 lb

## 2022-09-06 DIAGNOSIS — R87613 High grade squamous intraepithelial lesion on cytologic smear of cervix (HGSIL): Secondary | ICD-10-CM | POA: Insufficient documentation

## 2022-09-06 DIAGNOSIS — R87810 Cervical high risk human papillomavirus (HPV) DNA test positive: Secondary | ICD-10-CM | POA: Diagnosis not present

## 2022-09-06 DIAGNOSIS — Z3202 Encounter for pregnancy test, result negative: Secondary | ICD-10-CM | POA: Diagnosis not present

## 2022-09-06 LAB — POCT URINE PREGNANCY: Preg Test, Ur: NEGATIVE

## 2022-09-06 NOTE — Patient Instructions (Signed)
Colposcopy, Care After  The following information offers guidance on how to care for yourself after your procedure. Your doctor may also give you more specific instructions. If you have problems or questions, contact your doctor. What can I expect after the procedure? If you did not have a sample of your tissue taken out (did not have a biopsy), you may only have some spotting of blood for a few days. You can go back to your normal activities. If you had a sample of your tissue taken out, it is common to have: Soreness and mild pain. These may last for a few days. Mild bleeding or fluid (discharge) coming from your vagina. The fluid will look dark and grainy. You may have this for a few days. The fluid may be caused by a liquid that was used during your procedure. You may need to wear a sanitary pad. Spotting of blood for at least 48 hours after the procedure. Follow these instructions at home: Medicines Take over-the-counter and prescription medicines only as told by your doctor. Ask your doctor what over-the-counter pain medicines and prescription medicines you can start taking again. This is very important if you take blood thinners. Activity For at least 3 days, or for as long as told by your doctor, avoid: Douching. Using tampons. Having sex. Return to your normal activities as told by your doctor. Ask your doctor what activities are safe for you. General instructions Ask your doctor if you may take baths, swim, or use a hot tub. You may take showers. If you use birth control (contraception), keep using it. Keep all follow-up visits. Contact a doctor if: You have a fever or chills. You faint or feel light-headed. Get help right away if: You bleed a lot from your vagina. A lot of bleeding means that the bleeding soaks through a pad in less than 1 hour. You have clumps of blood (blood clots) coming from your vagina. You have signs that could mean you have an infection. This may be  fluid coming from your vagina that is: Different than normal. Yellow. Bad-smelling. You have very bad pain or cramps in your lower belly that do not get better with medicine. Summary If you did not have a sample of your tissue taken out, you may only have some spotting of blood for a few days. You can go back to your normal activities. If you had a sample of your tissue taken out, it is common to have mild pain for a few days and spotting for 48 hours. Avoid douching, using tampons, and having sex for at least 3 days after the procedure or for as long as told. Get help right away if you have a lot of bleeding, very bad pain, or signs of infection. This information is not intended to replace advice given to you by your health care provider. Make sure you discuss any questions you have with your health care provider. Document Revised: 06/05/2020 Document Reviewed: 06/05/2020 Elsevier Patient Education  2024 [employer]  

## 2022-09-06 NOTE — Progress Notes (Signed)
    GYNECOLOGY CLINIC COLPOSCOPY PROCEDURE NOTE  37 y.o. [id number] here for colposcopy for high-grade squamous intraepithelial neoplasia  (HGSIL-encompassing moderate and severe dysplasia) pap smear on 4/24. Discussed role for HPV in cervical dysplasia, need for surveillance.  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  Placed in lithotomy position. Cervix viewed with speculum and colposcope after application of acetic acid.   Colposcopy adequate? Yes  acetowhite lesion(s) noted at 6 & 12 o'clock; corresponding biopsies obtained.  ECC specimen obtained. Monsel's applied All specimens were labelled and sent to pathology.  Patient was given post procedure instructions.  Will follow up pathology and manage accordingly.  Routine preventative health maintenance measures emphasized.    Nettie Elm  MD, FACOG Attending Obstetrician & Gynecologist Center for [facility], [facility]

## 2022-09-06 NOTE — Progress Notes (Signed)
37 y.o. GYN presents for COLPO.    UPT Today is Negative.

## 2022-09-10 LAB — SURGICAL PATHOLOGY

## 2022-10-01 ENCOUNTER — Ambulatory Visit (INDEPENDENT_AMBULATORY_CARE_PROVIDER_SITE_OTHER): Payer: Medicaid Other | Admitting: Obstetrics and Gynecology

## 2022-10-01 ENCOUNTER — Encounter: Payer: Self-pay | Admitting: Obstetrics and Gynecology

## 2022-10-01 VITALS — BP 108/75 | HR 69 | Wt 267.0 lb

## 2022-10-01 DIAGNOSIS — R87613 High grade squamous intraepithelial lesion on cytologic smear of cervix (HGSIL): Secondary | ICD-10-CM | POA: Diagnosis not present

## 2022-10-01 NOTE — Progress Notes (Signed)
Lisa Wright presents for discussion of her recent Colpo Bx returned as CIN 3 H/O LEEP on the past  Dx reviewed with pt  PE AF VSS Lungs clear  Heart RRR Abd soft + BS  A/P CIN 3  CKC recommended and reviewed with pt. R/B/Post care discussed Information provided. Will schedule. F/U with post op appt.

## 2022-10-01 NOTE — Progress Notes (Signed)
Pt in office for colpo results and discuss.  Pt also wants to discuss cycles - heavy/clots and more painful.  Pt states no relief with OTC medication.

## 2022-10-15 ENCOUNTER — Other Ambulatory Visit: Payer: Self-pay

## 2022-10-15 ENCOUNTER — Encounter (HOSPITAL_COMMUNITY): Payer: Self-pay | Admitting: Obstetrics and Gynecology

## 2022-10-15 NOTE — Progress Notes (Signed)
SDW CALL  Patient was given pre-op instructions over the phone. The opportunity was given for the patient to ask questions. No further questions asked. Patient verbalized understanding of instructions given.   PCP - [facility] Cardiologist - denies  Chest x-ray - n/a EKG - n/a Stress Test - denies  ECHO - denies Cardiac Cath - denies  ERAS Protcol - clears until 5:40am PRE-SURGERY Ensure or G2-   COVID TEST- no   Anesthesia review: No  Patient denies shortness of breath, fever, cough and chest pain over the phone call   All instructions explained to the patient, with a verbal understanding of the material. Patient agrees to go over the instructions while at home for a better understanding.

## 2022-10-15 NOTE — H&P (Signed)
Lisa Wright is an 37 y.o. 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    Past Medical History:  Diagnosis Date   Anxiety    Bipolar disorder (HCC)    Cervical cancer (HCC) 2016   Cesarean delivery delivered 11/04/2019   Chlamydia    COVID    12/2021   Depression    GERD (gastroesophageal reflux disease)    Migraines    Trichomonas infection    Vaginal Pap smear, abnormal     Past Surgical History:  Procedure Laterality Date   CESAREAN SECTION     CESAREAN SECTION     CESAREAN SECTION N/A 11/04/2019   Procedure: CESAREAN SECTION;  Surgeon: Catalina Antigua, MD;  Location: [facility] LD ORS;  Service: Obstetrics;  Laterality: N/A;   COLPOSCOPY     IRRIGATION AND DEBRIDEMENT SEBACEOUS CYST  06/05/2022   Procedure: IRRIGATION AND DEBRIDEMENT LABIAL SEBACEOUS CYSTS;  Surgeon: [name], MD;  Location: [facility] OR;  Service: Gynecology;;   TUBAL LIGATION      Family History  Problem Relation Age of Onset   Diabetes Father    Hypertension Father    Dementia Maternal Grandmother    Diabetes Maternal Grandfather    Diabetes Paternal Grandmother    Stroke Mother    Lung cancer Mother     Social History:  reports that she has been smoking cigarettes. She started smoking about 16 years ago. She has a 4.2 pack-year smoking history. She has never used smokeless tobacco. She reports current alcohol use. She reports that she does not use drugs.  Allergies:  Allergies  Allergen Reactions   Ibuprofen Nausea And Vomiting   Citalopram Other (See Comments)    Felt like a zombie    Zoloft [Sertraline] Other (See Comments)    Felt like a zombie     No medications prior to admission.    Review of Systems  Constitutional: Negative.   Respiratory: Negative.    Cardiovascular: Negative.   Gastrointestinal: Negative.   Genitourinary: Negative.     Last menstrual period  09/30/2022, unknown if currently breastfeeding. Physical Exam Constitutional:      Appearance: Normal appearance.  Cardiovascular:     Rate and Rhythm: Normal rate and regular rhythm.  Pulmonary:     Effort: Pulmonary effort is normal.     Breath sounds: Normal breath sounds.  Abdominal:     General: Bowel sounds are normal.     Palpations: Abdomen is soft.  Genitourinary:    Comments: Nl EGBUS, uterus small, mobile, non tender, no masses Neurological:     Mental Status: She is alert.     No results found for this or any previous visit (from the past 24 hour(s)).  No results found.  Assessment/Plan: CIN 3  CKC recommended to pt due to prior LEEP and current colpo Bx of CIN 3 R/B/Post op care reviewed with pt. Pt agrees to proceed.   [name] 10/15/2022, 2:32 PM

## 2022-10-15 NOTE — Anesthesia Preprocedure Evaluation (Signed)
Anesthesia Evaluation  Patient identified by MRN, date of birth, ID band Patient awake    Reviewed: Allergy & Precautions, NPO status , Patient's Chart, lab work & pertinent test results  Airway Mallampati: III  TM Distance: >3 FB Neck ROM: Full    Dental no notable dental hx. (+) Dental Advisory Given, Teeth Intact   Pulmonary asthma , Current Smoker and Patient abstained from smoking. 4-5cigg/d Albuterol rarely uses   Pulmonary exam normal breath sounds clear to auscultation       Cardiovascular negative cardio ROS Normal cardiovascular exam Rhythm:Regular Rate:Normal     Neuro/Psych  Headaches PSYCHIATRIC DISORDERS Anxiety Depression Bipolar Disorder      GI/Hepatic Neg liver ROS,GERD  Medicated and Controlled,,  Endo/Other    Morbid obesityBMI 41  Renal/GU negative Renal ROS  negative genitourinary   Musculoskeletal negative musculoskeletal ROS (+)    Abdominal  (+) + obese  Peds  Hematology negative hematology ROS (+)   Anesthesia Other Findings   Reproductive/Obstetrics Urine preg neg                             Anesthesia Physical Anesthesia Plan  ASA: 3  Anesthesia Plan: General   Post-op Pain Management: Tylenol PO (pre-op)* and Toradol IV (intra-op)*   Induction: Intravenous  PONV Risk Score and Plan: 3 and Ondansetron, Dexamethasone, Midazolam and Treatment may vary due to age or medical condition  Airway Management Planned: LMA  Additional Equipment: None  Intra-op Plan:   Post-operative Plan: Extubation in OR  Informed Consent: I have reviewed the patients History and Physical, chart, labs and discussed the procedure including the risks, benefits and alternatives for the proposed anesthesia with the patient or authorized representative who has indicated his/her understanding and acceptance.     Dental advisory given  Plan Discussed with: CRNA  Anesthesia  Plan Comments:        Anesthesia Quick Evaluation

## 2022-10-16 ENCOUNTER — Ambulatory Visit (HOSPITAL_COMMUNITY)
Admission: RE | Admit: 2022-10-16 | Discharge: 2022-10-16 | Disposition: A | Discharge status: Home / Self Care | Payer: Medicaid Other | Source: Ambulatory Visit | Attending: Obstetrics and Gynecology | Admitting: Obstetrics and Gynecology

## 2022-10-16 ENCOUNTER — Encounter (HOSPITAL_COMMUNITY)
Admission: RE | Disposition: A | Discharge status: Home / Self Care | Payer: Self-pay | Source: Ambulatory Visit | Attending: Obstetrics and Gynecology

## 2022-10-16 ENCOUNTER — Ambulatory Visit (HOSPITAL_BASED_OUTPATIENT_CLINIC_OR_DEPARTMENT_OTHER): Payer: Self-pay | Admitting: Anesthesiology

## 2022-10-16 ENCOUNTER — Other Ambulatory Visit: Payer: Self-pay

## 2022-10-16 ENCOUNTER — Ambulatory Visit (HOSPITAL_COMMUNITY): Payer: Medicaid Other | Admitting: Anesthesiology

## 2022-10-16 ENCOUNTER — Telehealth (HOSPITAL_COMMUNITY): Payer: Self-pay

## 2022-10-16 ENCOUNTER — Encounter (HOSPITAL_COMMUNITY): Payer: Self-pay | Admitting: Obstetrics and Gynecology

## 2022-10-16 DIAGNOSIS — Z6841 Body Mass Index (BMI) 40.0 and over, adult: Secondary | ICD-10-CM | POA: Diagnosis not present

## 2022-10-16 DIAGNOSIS — D069 Carcinoma in situ of cervix, unspecified: Secondary | ICD-10-CM

## 2022-10-16 DIAGNOSIS — K219 Gastro-esophageal reflux disease without esophagitis: Secondary | ICD-10-CM | POA: Diagnosis not present

## 2022-10-16 DIAGNOSIS — Z01818 Encounter for other preprocedural examination: Secondary | ICD-10-CM

## 2022-10-16 DIAGNOSIS — F418 Other specified anxiety disorders: Secondary | ICD-10-CM | POA: Diagnosis not present

## 2022-10-16 DIAGNOSIS — F17210 Nicotine dependence, cigarettes, uncomplicated: Secondary | ICD-10-CM | POA: Diagnosis not present

## 2022-10-16 DIAGNOSIS — R87613 High grade squamous intraepithelial lesion on cytologic smear of cervix (HGSIL): Secondary | ICD-10-CM

## 2022-10-16 DIAGNOSIS — J45909 Unspecified asthma, uncomplicated: Secondary | ICD-10-CM | POA: Diagnosis not present

## 2022-10-16 HISTORY — PX: CERVICAL CONIZATION W/BX: SHX1330

## 2022-10-16 LAB — CBC
HCT: 40 % (ref 36.0–46.0)
Hemoglobin: 12.8 g/dL (ref 12.0–15.0)
MCH: 28.8 pg (ref 26.0–34.0)
MCHC: 32 g/dL (ref 30.0–36.0)
MCV: 90.1 fL (ref 80.0–100.0)
Platelets: 334 10*3/uL (ref 150–400)
RBC: 4.44 MIL/uL (ref 3.87–5.11)
RDW: 13.2 % (ref 11.5–15.5)
WBC: 11.3 10*3/uL — ABNORMAL HIGH (ref 4.0–10.5)
nRBC: 0 % (ref 0.0–0.2)

## 2022-10-16 LAB — POCT PREGNANCY, URINE: Preg Test, Ur: NEGATIVE

## 2022-10-16 SURGERY — CONE BIOPSY, CERVIX
Anesthesia: General | Site: Cervix

## 2022-10-16 MED ORDER — ONDANSETRON HCL 4 MG/2ML IJ SOLN
INTRAMUSCULAR | Status: DC | PRN
  Administered 2022-10-16: 4 mg via INTRAVENOUS

## 2022-10-16 MED ORDER — SCOPOLAMINE 1 MG/3DAYS TD PT72
MEDICATED_PATCH | TRANSDERMAL | Status: AC
  Administered 2022-10-16: 1.5 mg via TRANSDERMAL
  Filled 2022-10-16: qty 1

## 2022-10-16 MED ORDER — ACETAMINOPHEN 500 MG PO TABS: 1000.0000 mg | ORAL_TABLET | Freq: Once | ORAL | Status: AC

## 2022-10-16 MED ORDER — OXYCODONE HCL 5 MG/5ML PO SOLN: 5.0000 mg | Freq: Once | ORAL | Status: AC | PRN

## 2022-10-16 MED ORDER — AMISULPRIDE (ANTIEMETIC) 5 MG/2ML IV SOLN: 10.0000 mg | Freq: Once | INTRAVENOUS | Status: DC | PRN

## 2022-10-16 MED ORDER — KETOROLAC TROMETHAMINE 15 MG/ML IJ SOLN
INTRAMUSCULAR | Status: AC
  Administered 2022-10-16: 30 mg via INTRAVENOUS
  Filled 2022-10-16: qty 1

## 2022-10-16 MED ORDER — DEXAMETHASONE SODIUM PHOSPHATE 10 MG/ML IJ SOLN
INTRAMUSCULAR | Status: DC | PRN
  Administered 2022-10-16: 8 mg via INTRAVENOUS

## 2022-10-16 MED ORDER — MIDAZOLAM HCL 2 MG/2ML IJ SOLN
INTRAMUSCULAR | Status: AC
  Filled 2022-10-16: qty 2

## 2022-10-16 MED ORDER — SOD CITRATE-CITRIC ACID 500-334 MG/5ML PO SOLN: 30.0000 mL | ORAL | Status: AC

## 2022-10-16 MED ORDER — KETOROLAC TROMETHAMINE 30 MG/ML IJ SOLN: 30.0000 mg | Freq: Once | INTRAMUSCULAR | Status: DC | PRN

## 2022-10-16 MED ORDER — MEPERIDINE HCL 25 MG/ML IJ SOLN: 6.2500 mg | INTRAMUSCULAR | Status: DC | PRN

## 2022-10-16 MED ORDER — ORAL CARE MOUTH RINSE: 15.0000 mL | Freq: Once | OROMUCOSAL | Status: AC

## 2022-10-16 MED ORDER — KETOROLAC TROMETHAMINE 15 MG/ML IJ SOLN: 30.0000 mg | INTRAMUSCULAR | Status: AC

## 2022-10-16 MED ORDER — FENTANYL CITRATE (PF) 250 MCG/5ML IJ SOLN
INTRAMUSCULAR | Status: DC | PRN
  Administered 2022-10-16: 100 ug via INTRAVENOUS
  Administered 2022-10-16: 50 ug via INTRAVENOUS

## 2022-10-16 MED ORDER — POVIDONE-IODINE 10 % EX SWAB
2.0000 | Freq: Once | CUTANEOUS | Status: AC
  Administered 2022-10-16: 2 via TOPICAL

## 2022-10-16 MED ORDER — PROPOFOL 10 MG/ML IV BOLUS
INTRAVENOUS | Status: DC | PRN
  Administered 2022-10-16: 2 mg via INTRAVENOUS

## 2022-10-16 MED ORDER — ACETAMINOPHEN 500 MG PO TABS: 1000.0000 mg | ORAL_TABLET | ORAL | Status: DC

## 2022-10-16 MED ORDER — LIDOCAINE 2% (20 MG/ML) 5 ML SYRINGE
INTRAMUSCULAR | Status: DC | PRN
  Administered 2022-10-16: 60 mg via INTRAVENOUS

## 2022-10-16 MED ORDER — OXYCODONE HCL 5 MG PO TABS
ORAL_TABLET | ORAL | Status: AC
  Filled 2022-10-16: qty 1

## 2022-10-16 MED ORDER — MIDAZOLAM HCL 2 MG/2ML IJ SOLN
INTRAMUSCULAR | Status: DC | PRN
  Administered 2022-10-16: 2 mg via INTRAVENOUS

## 2022-10-16 MED ORDER — LACTATED RINGERS IV SOLN: INTRAVENOUS | Status: DC

## 2022-10-16 MED ORDER — SCOPOLAMINE 1 MG/3DAYS TD PT72: 1.0000 | MEDICATED_PATCH | TRANSDERMAL | Status: DC

## 2022-10-16 MED ORDER — KETOROLAC TROMETHAMINE 30 MG/ML IJ SOLN
INTRAMUSCULAR | Status: AC
  Filled 2022-10-16: qty 1

## 2022-10-16 MED ORDER — 0.9 % SODIUM CHLORIDE (POUR BTL) OPTIME
TOPICAL | Status: DC | PRN
Start: 2022-10-16 — End: 2022-10-16
  Administered 2022-10-16: 1000 mL

## 2022-10-16 MED ORDER — PHENYLEPHRINE 80 MCG/ML (10ML) SYRINGE FOR IV PUSH (FOR BLOOD PRESSURE SUPPORT)
PREFILLED_SYRINGE | INTRAVENOUS | Status: AC
  Filled 2022-10-16: qty 10

## 2022-10-16 MED ORDER — SOD CITRATE-CITRIC ACID 500-334 MG/5ML PO SOLN
ORAL | Status: AC
  Administered 2022-10-16: 30 mL via ORAL
  Filled 2022-10-16: qty 30

## 2022-10-16 MED ORDER — LIDOCAINE 2% (20 MG/ML) 5 ML SYRINGE
INTRAMUSCULAR | Status: AC
  Filled 2022-10-16: qty 5

## 2022-10-16 MED ORDER — HYDROMORPHONE HCL 1 MG/ML IJ SOLN: 0.2500 mg | INTRAMUSCULAR | Status: DC | PRN

## 2022-10-16 MED ORDER — FENTANYL CITRATE (PF) 250 MCG/5ML IJ SOLN
INTRAMUSCULAR | Status: AC
  Filled 2022-10-16: qty 5

## 2022-10-16 MED ORDER — OXYCODONE HCL 5 MG PO TABS: 5.0000 mg | ORAL_TABLET | Freq: Four times a day (QID) | ORAL | 0 refills | Status: DC | PRN

## 2022-10-16 MED ORDER — CHLORHEXIDINE GLUCONATE 0.12 % MT SOLN: 15.0000 mL | Freq: Once | OROMUCOSAL | Status: AC

## 2022-10-16 MED ORDER — ONDANSETRON HCL 4 MG/2ML IJ SOLN
INTRAMUSCULAR | Status: AC
  Filled 2022-10-16: qty 2

## 2022-10-16 MED ORDER — PHENYLEPHRINE 80 MCG/ML (10ML) SYRINGE FOR IV PUSH (FOR BLOOD PRESSURE SUPPORT)
PREFILLED_SYRINGE | INTRAVENOUS | Status: DC | PRN
  Administered 2022-10-16 (×2): 80 ug via INTRAVENOUS
  Administered 2022-10-16: 160 ug via INTRAVENOUS

## 2022-10-16 MED ORDER — PROPOFOL 10 MG/ML IV BOLUS
INTRAVENOUS | Status: AC
  Filled 2022-10-16: qty 20

## 2022-10-16 MED ORDER — ONDANSETRON HCL 4 MG/2ML IJ SOLN: 4.0000 mg | Freq: Once | INTRAMUSCULAR | Status: DC | PRN

## 2022-10-16 MED ORDER — ACETAMINOPHEN 500 MG PO TABS
ORAL_TABLET | ORAL | Status: AC
  Administered 2022-10-16: 1000 mg via ORAL
  Filled 2022-10-16: qty 2

## 2022-10-16 MED ORDER — CHLORHEXIDINE GLUCONATE 0.12 % MT SOLN
OROMUCOSAL | Status: AC
  Administered 2022-10-16: 15 mL via OROMUCOSAL
  Filled 2022-10-16: qty 15

## 2022-10-16 MED ORDER — DEXAMETHASONE SODIUM PHOSPHATE 10 MG/ML IJ SOLN
INTRAMUSCULAR | Status: AC
  Filled 2022-10-16: qty 1

## 2022-10-16 MED ORDER — OXYCODONE HCL 5 MG PO TABS
5.0000 mg | ORAL_TABLET | Freq: Once | ORAL | Status: AC | PRN
  Administered 2022-10-16: 5 mg via ORAL

## 2022-10-16 SURGICAL SUPPLY — 22 items
APL SWBSTK 6 STRL LF DISP (MISCELLANEOUS) ×1
APPLICATOR COTTON TIP 6 STRL (MISCELLANEOUS) ×1 IMPLANT
APPLICATOR COTTON TIP 6IN STRL (MISCELLANEOUS) ×1
ATCH SMKEVC FLXB CAUT HNDSWH (FILTER) ×1 IMPLANT
BLADE SURG 11 STRL SS (BLADE) IMPLANT
CATH ROBINSON RED A/P 16FR (CATHETERS) IMPLANT
ELECT BALL LEEP 5MM RED (ELECTRODE) IMPLANT
ELECT REM PT RETURN 9FT ADLT (ELECTROSURGICAL) ×1
ELECTRODE REM PT RTRN 9FT ADLT (ELECTROSURGICAL) ×1 IMPLANT
GLOVE BIO SURGEON STRL SZ7.5 (GLOVE) ×1 IMPLANT
GLOVE BIOGEL PI IND STRL 7.0 (GLOVE) ×1 IMPLANT
GOWN STRL REUS W/ TWL LRG LVL3 (GOWN DISPOSABLE) ×1 IMPLANT
GOWN STRL REUS W/ TWL XL LVL3 (GOWN DISPOSABLE) ×1 IMPLANT
GOWN STRL REUS W/TWL LRG LVL3 (GOWN DISPOSABLE) ×1
GOWN STRL REUS W/TWL XL LVL3 (GOWN DISPOSABLE) ×1
NS IRRIG 1000ML POUR BTL (IV SOLUTION) ×1 IMPLANT
PACK VAGINAL MINOR WOMEN LF (CUSTOM PROCEDURE TRAY) ×1 IMPLANT
PAD OB MATERNITY 4.3X12.25 (PERSONAL CARE ITEMS) ×1 IMPLANT
PENCIL SMOKE EVACUATOR (MISCELLANEOUS) IMPLANT
SCOPETTES 8 STERILE (MISCELLANEOUS) ×1 IMPLANT
SUT VIC AB PLUS 45CM 1-MO-4 (SUTURE) ×1 IMPLANT
TOWEL GREEN STERILE FF (TOWEL DISPOSABLE) ×2 IMPLANT

## 2022-10-16 NOTE — Anesthesia Procedure Notes (Addendum)
Procedure Name: LMA Insertion Date/Time: 10/16/2022 9:05 AM  Performed by: April Holding, CRNAPre-anesthesia Checklist: Patient identified, Emergency Drugs available, Suction available and Patient being monitored Patient Re-evaluated:Patient Re-evaluated prior to induction Oxygen Delivery Method: Circle System Utilized Preoxygenation: Pre-oxygenation with 100% oxygen Induction Type: IV induction Ventilation: Mask ventilation without difficulty LMA: LMA inserted LMA Size: 4.0 Number of attempts: 1 Placement Confirmation: positive ETCO2 Tube secured with: Tape Dental Injury: Teeth and Oropharynx as per pre-operative assessment

## 2022-10-16 NOTE — Transfer of Care (Signed)
Immediate Anesthesia Transfer of Care Note  Patient: Lisa Wright  Procedure(s) Performed: CONIZATION CERVIX WITH BIOPSY (Cervix)  Patient Location: PACU  Anesthesia Type:General  Level of Consciousness: awake and alert   Airway & Oxygen Therapy: Patient Spontanous Breathing and Patient connected to nasal cannula oxygen  Post-op Assessment: Report given to RN and Post -op Vital signs reviewed and stable  Post vital signs: Reviewed and stable  Last Vitals:  Vitals Value Taken Time  BP    Temp    Pulse    Resp    SpO2      Last Pain:  Vitals:   10/16/22 0833  TempSrc:   PainSc: 0-No pain         Complications: No notable events documented.

## 2022-10-16 NOTE — Interval H&P Note (Signed)
History and Physical Interval Note:  10/16/2022 8:28 AM  Lisa Wright  has presented today for surgery, with the diagnosis of CIN 3.  The various methods of treatment have been discussed with the patient and family. After consideration of risks, benefits and other options for treatment, the patient has consented to  Procedure(s): CONIZATION CERVIX WITH BIOPSY (N/A) as a surgical intervention.  The patient's history has been reviewed, patient examined, no change in status, stable for surgery.  I have reviewed the patient's chart and labs.  Questions were answered to the patient's satisfaction.     [name]

## 2022-10-16 NOTE — Op Note (Signed)
Preoperative diagnosis: CIN 3  Postoperative diagnosis: Same  Procedure: Cervical conization  Surgeon: Nettie Elm M.D.  Anesthesia:  Lannie Fields, DO  Findings: Evidence of abnormal cervical tissue  Estimated blood loss: < 25  cc  Specimens: Cervical conization & ECC  Reason for procedure: Lisa Wright [id number] with h/o CIN 3  Procedure: Patient was taken to the operating room where  analgesia was administered.She was prepped and draped in the usual sterile fashion. A timeout was performed. The patient had SCDs in place. The patient was in dorsal lithotomy.  A weighted speculum was placed inside the vagina.  A Deaver was used anteriorly. The cervix was grasped with a single tooth tenaculum.  A 0 Vicryl suture on a CT-1 was used to put stay sutures in cervix from 10-8 and from 2-4 o'clock.  An 11 blade was used to take a 2 cm conization of cervix. ECC was obtained.  The electrocautery ball was used at the conization base. The conization was closed in a Studroff fashion.  Monsel's solution was used for hemostasis. All instrument, needle and lap counts were correct x 2. The patient was taken to recovery in stable condition.  Lisa Wright L. Lisa Penna, MD

## 2022-10-16 NOTE — Anesthesia Postprocedure Evaluation (Signed)
Anesthesia Post Note  Patient: Lisa Wright  Procedure(s) Performed: CONIZATION CERVIX WITH BIOPSY (Cervix)     Patient location during evaluation: PACU Anesthesia Type: General Level of consciousness: awake and alert, oriented and patient cooperative Pain management: pain level controlled Vital Signs Assessment: post-procedure vital signs reviewed and stable Respiratory status: spontaneous breathing, nonlabored ventilation and respiratory function stable Cardiovascular status: blood pressure returned to baseline and stable Postop Assessment: no apparent nausea or vomiting Anesthetic complications: no   No notable events documented.  Last Vitals:  Vitals:   10/16/22 1015 10/16/22 1030  BP: 122/72 115/72  Pulse: 62 (!) 57  Resp: 15 13  Temp:  36.6 C  SpO2: 98% 100%    Last Pain:  Vitals:   10/16/22 1030  TempSrc:   PainSc: Asleep                 Lannie Fields

## 2022-10-17 ENCOUNTER — Other Ambulatory Visit: Payer: Self-pay | Admitting: Obstetrics and Gynecology

## 2022-10-17 ENCOUNTER — Encounter (HOSPITAL_COMMUNITY): Payer: Self-pay | Admitting: Obstetrics and Gynecology

## 2022-10-17 DIAGNOSIS — G8918 Other acute postprocedural pain: Secondary | ICD-10-CM

## 2022-10-17 LAB — SURGICAL PATHOLOGY

## 2022-10-17 MED ORDER — HYDROMORPHONE HCL 2 MG PO TABS
2.0000 mg | ORAL_TABLET | ORAL | 0 refills | Status: DC | PRN
Start: 2022-10-17 — End: 2023-06-21

## 2022-10-31 ENCOUNTER — Encounter: Payer: Self-pay | Admitting: Obstetrics and Gynecology

## 2022-10-31 ENCOUNTER — Ambulatory Visit: Payer: Medicaid Other | Admitting: Obstetrics and Gynecology

## 2022-10-31 VITALS — BP 113/72 | HR 65 | Wt 263.0 lb

## 2022-10-31 DIAGNOSIS — Z9889 Other specified postprocedural states: Secondary | ICD-10-CM

## 2022-10-31 DIAGNOSIS — R87613 High grade squamous intraepithelial lesion on cytologic smear of cervix (HGSIL): Secondary | ICD-10-CM

## 2022-10-31 NOTE — Progress Notes (Signed)
Pt states she has been bleeding with clots since procedure.  Pt also states she still having some pain.

## 2022-10-31 NOTE — Progress Notes (Signed)
Lisa Wright presents for post op S/P CKC for CIN 3 Pathology confirmed and negative margins Pathology reviewed with pt Denies any bowel or bladder dysfunction On cycle now  PE AF VSS Chaperone present Lungs clear Heart RRR Abd soft + BS GU Nl EGBUS, cycle note, conization site healing well  A/P Post op  Return to nl ADL's Pelvic rest til 11/23/22 Repeat pap smear in 1 yr

## 2022-11-12 ENCOUNTER — Telehealth: Payer: Medicaid Other | Admitting: Family Medicine

## 2022-11-12 DIAGNOSIS — R109 Unspecified abdominal pain: Secondary | ICD-10-CM

## 2022-11-12 DIAGNOSIS — R300 Dysuria: Secondary | ICD-10-CM

## 2022-11-12 DIAGNOSIS — M545 Low back pain, unspecified: Secondary | ICD-10-CM

## 2022-11-12 NOTE — Progress Notes (Signed)
Since you are still within a 30 day time frame from surgery- we need to have you seen for a sample to make sure you are treated with the best medication for your infection, and make sure there is nothing else going on post your surgery.     NOTE: There will be NO CHARGE for this eVisit   If you are having a true medical emergency please call 911.

## 2022-11-20 ENCOUNTER — Ambulatory Visit (INDEPENDENT_AMBULATORY_CARE_PROVIDER_SITE_OTHER): Payer: Medicaid Other | Admitting: Mental Health

## 2022-11-20 DIAGNOSIS — F411 Generalized anxiety disorder: Secondary | ICD-10-CM | POA: Insufficient documentation

## 2022-11-20 DIAGNOSIS — F322 Major depressive disorder, single episode, severe without psychotic features: Secondary | ICD-10-CM | POA: Diagnosis not present

## 2022-11-20 DIAGNOSIS — F4310 Post-traumatic stress disorder, unspecified: Secondary | ICD-10-CM | POA: Insufficient documentation
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
## 2022-11-22 ENCOUNTER — Telehealth (HOSPITAL_COMMUNITY): Payer: Self-pay | Admitting: Mental Health

## 2022-11-22 NOTE — Telephone Encounter (Signed)
Therapist received email request for update from [facility] (ROI). Therapist provided information that pt completed intake assessment and provided follow up appointments.

## 2022-12-05 ENCOUNTER — Encounter (HOSPITAL_COMMUNITY): Payer: Self-pay

## 2022-12-05 ENCOUNTER — Ambulatory Visit (HOSPITAL_COMMUNITY): Payer: Medicaid Other | Admitting: Mental Health

## 2022-12-05 DIAGNOSIS — F4310 Post-traumatic stress disorder, unspecified: Secondary | ICD-10-CM

## 2022-12-05 DIAGNOSIS — F322 Major depressive disorder, single episode, severe without psychotic features: Secondary | ICD-10-CM | POA: Diagnosis not present

## 2022-12-05 DIAGNOSIS — F411 Generalized anxiety disorder: Secondary | ICD-10-CM

## 2022-12-05 NOTE — Progress Notes (Unsigned)
   THERAPIST PROGRESS NOTE  Session Time: 3: 16pm ( 35 minutes)   Participation Level: Active  Behavioral Response: CasualAlertAnxious  Type of Therapy: Individual Therapy  Treatment Goals addressed: STG: "My attitude."  Quadasia will increase management of moods AEB development of x 3 effective distress tolerance coping skills with ability to process thoughts in balanced manner within the next 90 days.   ProgressTowards Goals: Initial  Interventions: CBT and Supportive  Summary: Denetta Persell is a 37 y.o. female who presents with dx of major depression, severe, generalized anxiety and PTSD. Presents to session alert andoriented; mood and affect adequate; stable. Speech clear and coherent at normal rate and tone. Presents tos essio x 1 5 minute late. Shares for moods to have been ok but notes ongoing episodes of low mood. Shares thoughts on living in her childhood room with her x 4 sons. Notes has been working on organizing room and has been coming across items of her mothers and shares for it to have been an emotional day with grief of mother. Notes to have also found out an uncle of hers is not doing well in the hospital and will be transferred to hospice care. Shares thoughts of feeling as if everyone close to her is passing away and becomes tearful, sharing passing of x 2 aunts and a a cousin as well as mother in recent years. Shares difficulty finding a job with her criminal background and on probation. Shares has been more agitated and shares can have irritable outburst with children and others and need to work on her attitiude. Agrees to treatment plan with development of coping skills for stress. Denies SI/HI. Initial development of goals.    Suicidal/Homicidal: Nowithout intent/plan  Therapist Response: Therapist engaged Renn in therapy session. Completed check in and assessed for current level of functioning, sxs management and current stressors. Reviewed intake assessment, bounds of  confidentiality and informed consent. Provided safe space for Cally to share current concerns related psychosocial stressors and provided supportive feedback; validated feelings. Supported in navigating thoughts and ability to cope with stressors and anxiety. Normalized feelings of grief of mother. Educated on IPS services and ability to receive support in pursuing work. Explored current level of functioning and areas of progress needed. Discussed treatment planning. Explored coping skills and encouraged previous coping skills of creative expression with writing. Reviewed session and provided follow up.   Plan: Return again in  x 8 weeks.  Diagnosis: Severe major depression without psychotic features (HCC)  Generalized anxiety disorder  PTSD (post-traumatic stress disorder)  Collaboration of Care: Other referral to IPS  Patient/Guardian was advised Release of Information must be obtained prior to any record release in order to collaborate their care with an outside provider. Patient/Guardian was advised if they have not already done so to contact the registration department to sign all necessary forms in order for [place] to release information regarding their care.   Consent: Patient/Guardian gives verbal consent for treatment and assignment of benefits for services provided during this visit. Patient/Guardian expressed understanding and agreed to proceed.   Stephan Minister Rosenhayn, [facility] 12/05/2022

## 2022-12-11 ENCOUNTER — Encounter (HOSPITAL_COMMUNITY): Payer: Self-pay | Admitting: Physician Assistant

## 2022-12-11 ENCOUNTER — Ambulatory Visit (HOSPITAL_COMMUNITY): Payer: Medicaid Other | Admitting: Physician Assistant

## 2022-12-11 VITALS — BP 129/75 | HR 56 | Temp 98.2°F | Ht 67.0 in | Wt 266.6 lb

## 2022-12-11 DIAGNOSIS — F322 Major depressive disorder, single episode, severe without psychotic features: Secondary | ICD-10-CM

## 2022-12-11 DIAGNOSIS — Z634 Disappearance and death of family member: Secondary | ICD-10-CM | POA: Diagnosis not present

## 2022-12-11 DIAGNOSIS — F411 Generalized anxiety disorder: Secondary | ICD-10-CM

## 2022-12-11 DIAGNOSIS — F4310 Post-traumatic stress disorder, unspecified: Secondary | ICD-10-CM

## 2022-12-11 MED ORDER — BUSPIRONE HCL 7.5 MG PO TABS
7.5000 mg | ORAL_TABLET | Freq: Two times a day (BID) | ORAL | 1 refills | Status: AC
Start: 2022-12-11 — End: ?

## 2022-12-11 MED ORDER — VENLAFAXINE HCL ER 37.5 MG PO CP24
37.5000 mg | ORAL_CAPSULE | Freq: Every day | ORAL | 1 refills | Status: AC
Start: 2022-12-11 — End: ?
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
## 2023-01-25 ENCOUNTER — Encounter (HOSPITAL_COMMUNITY): Payer: Medicaid Other | Admitting: Physician Assistant

## 2023-02-12 ENCOUNTER — Encounter (HOSPITAL_COMMUNITY): Payer: Self-pay

## 2023-02-13 ENCOUNTER — Encounter (HOSPITAL_COMMUNITY): Payer: Self-pay

## 2023-02-13 ENCOUNTER — Ambulatory Visit (HOSPITAL_COMMUNITY): Payer: Medicaid Other | Admitting: Mental Health

## 2023-02-17 ENCOUNTER — Telehealth: Payer: Medicaid Other | Admitting: Family

## 2023-02-17 DIAGNOSIS — R109 Unspecified abdominal pain: Secondary | ICD-10-CM

## 2023-02-17 DIAGNOSIS — M549 Dorsalgia, unspecified: Secondary | ICD-10-CM

## 2023-02-17 DIAGNOSIS — N898 Other specified noninflammatory disorders of vagina: Secondary | ICD-10-CM

## 2023-02-17 NOTE — Progress Notes (Signed)
  Because vaginal discharge, back pain and abdominal pain, I feel your condition warrants further evaluation and I recommend that you be seen in a face-to-face visit.   NOTE: There will be NO CHARGE for this E-Visit   If you are having a true medical emergency, please call 911.     For an urgent face to face visit, [facility] has multiple urgent care centers for your convenience.  Click the link below for the full list of locations and hours, walk-in wait times, appointment scheduling options and driving directions:  Urgent Care - [place], [place], [place], [place], [place], [place]  [place]     Your MyChart E-visit questionnaire answers were reviewed by a board certified advanced clinical practitioner to complete your personal care plan based on your specific symptoms.    Thank you for using e-Visits.

## 2023-02-25 ENCOUNTER — Telehealth: Payer: Medicaid Other

## 2023-02-25 DIAGNOSIS — R6889 Other general symptoms and signs: Secondary | ICD-10-CM

## 2023-02-26 ENCOUNTER — Other Ambulatory Visit: Payer: Self-pay

## 2023-02-26 ENCOUNTER — Emergency Department (HOSPITAL_BASED_OUTPATIENT_CLINIC_OR_DEPARTMENT_OTHER): Payer: Medicaid Other

## 2023-02-26 DIAGNOSIS — J069 Acute upper respiratory infection, unspecified: Secondary | ICD-10-CM | POA: Insufficient documentation

## 2023-02-26 DIAGNOSIS — Z8541 Personal history of malignant neoplasm of cervix uteri: Secondary | ICD-10-CM | POA: Insufficient documentation

## 2023-02-26 DIAGNOSIS — N898 Other specified noninflammatory disorders of vagina: Secondary | ICD-10-CM | POA: Diagnosis present

## 2023-02-26 DIAGNOSIS — F17210 Nicotine dependence, cigarettes, uncomplicated: Secondary | ICD-10-CM | POA: Diagnosis not present

## 2023-02-26 DIAGNOSIS — Z20822 Contact with and (suspected) exposure to covid-19: Secondary | ICD-10-CM | POA: Insufficient documentation

## 2023-02-26 DIAGNOSIS — Z8616 Personal history of COVID-19: Secondary | ICD-10-CM | POA: Diagnosis not present

## 2023-02-26 LAB — URINALYSIS, ROUTINE W REFLEX MICROSCOPIC
Bilirubin Urine: NEGATIVE
Glucose, UA: NEGATIVE mg/dL
Hgb urine dipstick: NEGATIVE
Ketones, ur: NEGATIVE mg/dL
Leukocytes,Ua: NEGATIVE
Nitrite: NEGATIVE
Protein, ur: NEGATIVE mg/dL
Specific Gravity, Urine: 1.025 (ref 1.005–1.030)
pH: 6 (ref 5.0–8.0)

## 2023-02-26 LAB — RESP PANEL BY RT-PCR (RSV, FLU A&B, COVID)  RVPGX2
Influenza A by PCR: NEGATIVE
Influenza B by PCR: NEGATIVE
Resp Syncytial Virus by PCR: NEGATIVE
SARS Coronavirus 2 by RT PCR: NEGATIVE

## 2023-02-26 LAB — WET PREP, GENITAL
Clue Cells Wet Prep HPF POC: NONE SEEN
Sperm: NONE SEEN
Trich, Wet Prep: NONE SEEN
WBC, Wet Prep HPF POC: <10 (ref <10)
Yeast Wet Prep HPF POC: NONE SEEN

## 2023-02-26 LAB — PREGNANCY, URINE: Preg Test, Ur: NEGATIVE

## 2023-02-26 NOTE — Progress Notes (Signed)
  Because you reported coughing up blood, chest pain and shortness of breath, I feel your condition warrants further evaluation and I recommend that you be seen in a face-to-face visit.   NOTE: There will be NO CHARGE for this E-Visit   If you are having a true medical emergency, please call 911.     For an urgent face to face visit, [facility] has multiple urgent care centers for your convenience.  Click the link below for the full list of locations and hours, walk-in wait times, appointment scheduling options and driving directions:  Urgent Care - [place], [place], [place], [place], [place], [place]  [place]     Your MyChart E-visit questionnaire answers were reviewed by a board certified advanced clinical practitioner to complete your personal care plan based on your specific symptoms.    Thank you for using e-Visits. I have spent 5 minutes in review of e-visit questionnaire, review and updating patient chart, medical decision making and response to patient.   Kirk RAMAN Mayers, PA-C

## 2023-02-26 NOTE — ED Triage Notes (Signed)
Pt states URI SX X2 days family was sick. Vaginal discharge has Hix of BV, noticed dark urine.

## 2023-02-27 ENCOUNTER — Emergency Department (HOSPITAL_BASED_OUTPATIENT_CLINIC_OR_DEPARTMENT_OTHER)
Admission: EM | Admit: 2023-02-27 | Discharge: 2023-02-27 | Disposition: A | Discharge status: Home / Self Care | Payer: Medicaid Other | Attending: Emergency Medicine | Admitting: Emergency Medicine

## 2023-02-27 DIAGNOSIS — J069 Acute upper respiratory infection, unspecified: Secondary | ICD-10-CM

## 2023-02-27 DIAGNOSIS — N898 Other specified noninflammatory disorders of vagina: Secondary | ICD-10-CM

## 2023-02-27 MED ORDER — PSEUDOEPHEDRINE HCL 60 MG PO TABS: 60.0000 mg | ORAL_TABLET | Freq: Four times a day (QID) | ORAL | 0 refills | Status: DC | PRN

## 2023-02-27 MED ORDER — DOXYCYCLINE HYCLATE 100 MG PO TABS: 100.0000 mg | ORAL_TABLET | Freq: Two times a day (BID) | ORAL | 0 refills | Status: DC

## 2023-02-27 MED ORDER — CEFTRIAXONE SODIUM 500 MG IJ SOLR
500.0000 mg | Freq: Once | INTRAMUSCULAR | Status: AC
  Administered 2023-02-27: 500 mg via INTRAMUSCULAR
  Filled 2023-02-27: qty 500

## 2023-02-27 MED ORDER — OXYMETAZOLINE HCL 0.05 % NA SOLN: 1.0000 | Freq: Two times a day (BID) | NASAL | 0 refills | Status: AC

## 2023-02-27 MED ORDER — GUAIFENESIN-DM 100-10 MG/5ML PO SYRP: 5.0000 mL | ORAL_SOLUTION | Freq: Four times a day (QID) | ORAL | 0 refills | Status: DC | PRN

## 2023-02-27 MED ORDER — LIDOCAINE HCL (PF) 1 % IJ SOLN
1.0000 mL | Freq: Once | INTRAMUSCULAR | Status: AC
  Administered 2023-02-27: 1 mL
  Filled 2023-02-27: qty 5

## 2023-02-27 MED ORDER — ONDANSETRON HCL 4 MG PO TABS: 4.0000 mg | ORAL_TABLET | Freq: Three times a day (TID) | ORAL | Status: DC | PRN

## 2023-02-27 MED ORDER — ACETAMINOPHEN 325 MG PO TABS: 650.0000 mg | ORAL_TABLET | Freq: Four times a day (QID) | ORAL | 0 refills | Status: DC | PRN

## 2023-02-27 NOTE — Discharge Instructions (Addendum)
 The results of your STD testing will be available on MyChart, these can take 24 to 48 hours to result.  Please refrain from sexual activity until you have received a negative test result or if the result is positive refrain until you have completed antibiotics. Please follow up with your OBGYN.    You have been seen in the Emergency Department (ED) today for a likely viral illness.  Please drink plenty of clear fluids (water , Gatorade, chicken broth, etc).  You may use Tylenol  and/or Motrin  according to label instructions.  You can alternate between the two without any side effects.   Please follow up with your doctor as listed above.  Call your doctor or return to the Emergency Department (ED) if you are unable to tolerate fluids due to vomiting, have worsening trouble breathing, become extremely tired or difficult to awaken, or if you develop any other symptoms that concern you.

## 2023-02-27 NOTE — ED Provider Notes (Signed)
 [place] [facility] Provider Note  CSN: [id number] Arrival date & time: 02/26/23 2132  Chief Complaint(s) URI and Vaginal Discharge  HPI Lisa Wright is a 38 y.o. female with past medical history as below, significant for bipolar disorder, bilateral tubal ligation, MDD, PTSD, GAD, obesity who presents to the ED with complaint of symptoms, vaginal discharge  Patient reports that she has been having URI symptoms over the past 2 days, multiple family merged with similar symptoms.  Body aches, cough, subjective fevers, nausea, poor appetite, rhinorrhea.  Also had some diarrhea.  She has been having some vaginal discharge over the past few days, white, some pain with intercourse.  No rash no abnormal vaginal bleeding.  Past Medical History Past Medical History:  Diagnosis Date   Anxiety    Bipolar disorder (HCC)    Cervical cancer (HCC) 2016   Cesarean delivery delivered 11/04/2019   Chlamydia    COVID    12/2021   Depression    GERD (gastroesophageal reflux disease)    Migraines    Trichomonas infection    Vaginal Pap smear, abnormal    Patient Active Problem List   Diagnosis Date Noted   Severe major depression without psychotic features (HCC) 11/20/2022   PTSD (post-traumatic stress disorder) 11/20/2022   Generalized anxiety disorder 11/20/2022   Post-operative state 10/31/2022   HGSIL on cytologic smear of cervix 07/10/2022   History of bilateral tubal ligation 11/04/2019   Obesity 07/29/2014   Hidradenitis suppurativa 07/29/2014   Home Medication(s) Prior to Admission medications   Medication Sig Start Date End Date Taking? Authorizing Provider  doxycycline  (VIBRA -TABS) 100 MG tablet Take 1 tablet (100 mg total) by mouth 2 (two) times daily. 02/27/23  Yes Elnor Savant A, DO  AMBULATORY NON FORMULARY MEDICATION 1 Device by Other route once a week. Blood pressure cuff/ Large  Monitored Regularly at home  ICD 10 Z34.90 LROB 04/21/19    Trudy Earnie CROME, CNM  Ascorbic Acid (VITAMIN C) 1000 MG tablet Take 1,000 mg by mouth daily.    [provider]  buPROPion (WELLBUTRIN XL) 150 MG 24 hr tablet Take 150 mg by mouth every morning. 05/18/22   [provider]  busPIRone  (BUSPAR ) 7.5 MG tablet Take 1 tablet (7.5 mg total) by mouth 2 (two) times daily. 12/11/22   Nwoko, Uchenna E, PA  cetirizine  (ZYRTEC  ALLERGY) 10 MG tablet Take 1 tablet (10 mg total) by mouth daily. 05/11/22   Rudy Carlin LABOR, MD  HYDROmorphone  (DILAUDID ) 2 MG tablet Take 1 tablet (2 mg total) by mouth every 4 (four) hours as needed for severe pain. 10/17/22   Ervin, Michael L, MD  hydrOXYzine (ATARAX) 25 MG tablet Take 12.5-50 mg by mouth at bedtime as needed (Sleep). 05/18/22   [provider]  omeprazole  (PRILOSEC) 40 MG capsule Take 40 mg by mouth daily. 09/26/22   [provider]  pantoprazole  (PROTONIX ) 40 MG tablet Take 1 tablet (40 mg total) by mouth daily. 09/03/19   Stinson, Jacob J, DO  venlafaxine  XR (EFFEXOR -XR) 37.5 MG 24 hr capsule Take 1 capsule (37.5 mg total) by mouth daily with breakfast. 12/11/22   Nwoko, Uchenna E, PA  VENTOLIN HFA 108 (90 Base) MCG/ACT inhaler Inhale 2 puffs into the lungs 3 (three) times daily as needed for wheezing or shortness of breath. 03/09/22   [provider]  Vitamin D, Ergocalciferol, (DRISDOL) 1.25 MG (50000 UNIT) CAPS capsule Take 50,000 Units by mouth once a week. 04/02/22  [provider]                                                                                                                                    Past Surgical History Past Surgical History:  Procedure Laterality Date   CERVICAL CONIZATION W/BX N/A 10/16/2022   Procedure: CONIZATION CERVIX WITH BIOPSY;  Surgeon: Lorence Ozell CROME, MD;  Location: [facility] OR;  Service: Gynecology;  Laterality: N/A;   CESAREAN SECTION     CESAREAN SECTION     CESAREAN SECTION N/A 11/04/2019   Procedure: CESAREAN SECTION;   Surgeon: Alger Gong, MD;  Location: [facility] LD ORS;  Service: Obstetrics;  Laterality: N/A;   COLPOSCOPY     IRRIGATION AND DEBRIDEMENT SEBACEOUS CYST  06/05/2022   Procedure: IRRIGATION AND DEBRIDEMENT LABIAL SEBACEOUS CYSTS;  Surgeon: Lorence Ozell CROME, MD;  Location: [facility] OR;  Service: Gynecology;;   TUBAL LIGATION     Family History Family History  Problem Relation Age of Onset   Diabetes Father    Hypertension Father    Dementia Maternal Grandmother    Diabetes Maternal Grandfather    Diabetes Paternal Grandmother    Stroke Mother    Lung cancer Mother     Social History Social History   Tobacco Use   Smoking status: Every Day    Current packs/day: 0.00    Average packs/day: 0.3 packs/day for 14.0 years (4.2 ttl pk-yrs)    Types: Cigarettes    Start date: 10/28/2005    Last attempt to quit: 10/29/2019    Years since quitting: 3.3   Smokeless tobacco: Never   Tobacco comments:    1 pack every 3 days  Vaping Use   Vaping status: Never Used  Substance Use Topics   Alcohol use: Yes    Comment: socially   Drug use: No   Allergies Ibuprofen , Citalopram , and Zoloft [sertraline]  Review of Systems Review of Systems  Constitutional:  Positive for chills and fever.  HENT:  Positive for congestion, postnasal drip, rhinorrhea, sinus pressure and sore throat.   Respiratory:  Positive for cough. Negative for chest tightness.   Cardiovascular:  Negative for chest pain.  Gastrointestinal:  Positive for diarrhea and nausea.  Genitourinary:  Positive for dyspareunia and vaginal discharge. Negative for hematuria.  Neurological:  Negative for syncope.  All other systems reviewed and are negative.   Physical Exam Vital Signs  I have reviewed the triage vital signs BP 131/72 (BP Location: Right Arm)   Pulse 76   Temp 97.7 F (36.5 C)   Resp 18   Ht 5' 6 (1.676 m)   Wt 121.1 kg   LMP 01/24/2023 (Exact Date)   SpO2 98%   BMI 43.09 kg/m  Physical Exam Vitals and nursing  note reviewed.  Constitutional:      General: She is not in acute distress.    Appearance: Normal appearance. She is obese.  HENT:  Head: Normocephalic and atraumatic.     Right Ear: External ear normal.     Left Ear: External ear normal.     Nose: Rhinorrhea present. Rhinorrhea is clear.     Mouth/Throat:     Mouth: Mucous membranes are moist.  Eyes:     General: No scleral icterus.       Right eye: No discharge.        Left eye: No discharge.  Cardiovascular:     Rate and Rhythm: Normal rate and regular rhythm.     Pulses: Normal pulses.     Heart sounds: Normal heart sounds.  Pulmonary:     Effort: Pulmonary effort is normal. No respiratory distress.     Breath sounds: Normal breath sounds. No stridor.  Abdominal:     General: Abdomen is flat. There is no distension.     Palpations: Abdomen is soft.     Tenderness: There is no abdominal tenderness.  Musculoskeletal:     Cervical back: No rigidity.     Right lower leg: No edema.     Left lower leg: No edema.  Skin:    General: Skin is warm and dry.     Capillary Refill: Capillary refill takes less than 2 seconds.  Neurological:     Mental Status: She is alert.  Psychiatric:        Mood and Affect: Mood normal.        Behavior: Behavior normal. Behavior is cooperative.     ED Results and Treatments Labs (all labs ordered are listed, but only abnormal results are displayed) Labs Reviewed  WET PREP, GENITAL  RESP PANEL BY RT-PCR (RSV, FLU A&B, COVID)  RVPGX2  URINALYSIS, ROUTINE W REFLEX MICROSCOPIC  PREGNANCY, URINE  GC/CHLAMYDIA PROBE AMP ([place]) NOT AT [facility]                                                                                                                          Radiology DG Chest 2 View Result Date: 02/26/2023 CLINICAL DATA:  Upper respiratory infection. Flu-like symptoms for 48 hours. EXAM: CHEST - 2 VIEW COMPARISON:  None Available. FINDINGS: The heart size and mediastinal contours are  within normal limits. Both lungs are clear. The visualized skeletal structures are unremarkable. IMPRESSION: No active cardiopulmonary disease. Electronically Signed   By: Elsie Gravely M.D.   On: 02/26/2023 23:46    Pertinent labs & imaging results that were available during my care of the patient were reviewed by me and considered in my medical decision making (see MDM for details).  Medications Ordered in ED Medications  cefTRIAXone  (ROCEPHIN ) injection 500 mg (has no administration in time range)  lidocaine  (PF) (XYLOCAINE ) 1 % injection 1-2.1 mL (has no administration in time range)  Procedures Procedures  (including critical care time)  Medical Decision Making / ED Course    Medical Decision Making:    Bryann Trezure Cronk is a 38 y.o. female with past medical history as below, significant for bipolar disorder, bilateral tubal ligation, MDD, PTSD, GAD, obesity who presents to the ED with complaint of symptoms, vaginal discharge. The complaint involves an extensive differential diagnosis and also carries with it a high risk of complications and morbidity.  Serious etiology was considered. Ddx includes but is not limited to: Viral syndrome, pneumonia, influenza, UTI, STI, nephrolithiasis, etc.  Complete initial physical exam performed, notably the patient was in no acute distress, no hypoxia, abdomen nonperitoneal, lungs clear bilateral.    Reviewed and confirmed nursing documentation for past medical history, family history, social history.  Vital signs reviewed.        Brief summary: Well-appearing 38 year old female history as above here with multi complaints, URI, vaginal discharge.  Is well-appearing, no hypoxia, lungs clear, abdomen on peritoneal.  Vital signs stable.  No sick contacts with similar symptoms.  Viral swabs  ordered in triage are  negative.  Pregnancy test, urinalysis and wet prep are negative.  She also self swab for GC and chlamydia prior to my evaluation.  She is agreeable to empiric STI treatment.  SHe is currently sexually active.  Denies any known STI exposure.  Discussed supportive care for URI symptoms at home.  Rehydration, outpatient follow-up, strict return precautions.  Also treat empirically for GC and chlamydia with Rocephin  and Doxy, advised to refrain from sexual intercourse until completion of treatment.  She will follow-up on MyChart regarding these results  The patient improved significantly and was discharged in stable condition. Detailed discussions were had with the patient/guardian regarding current findings, and need for close f/u with PCP or on call doctor. The patient/guardian has been instructed to return immediately if the symptoms worsen in any way for re-evaluation. Patient/guardian verbalized understanding and is in agreement with current care plan. All questions answered prior to discharge.                  Additional history obtained: -Additional history obtained from na -External records from outside source obtained and reviewed including: Chart review including previous notes, labs, imaging, consultation notes including  Primary care documentation, prior admission, prior labs and imaging, allergies   Lab Tests: -I ordered, reviewed, and interpreted labs.   The pertinent results include:   Labs Reviewed  WET PREP, GENITAL  RESP PANEL BY RT-PCR (RSV, FLU A&B, COVID)  RVPGX2  URINALYSIS, ROUTINE W REFLEX MICROSCOPIC  PREGNANCY, URINE  GC/CHLAMYDIA PROBE AMP ([place]) NOT AT [facility]    Notable for so far wnl, gc/chl pending  EKG   EKG Interpretation Date/Time:    Ventricular Rate:    PR Interval:    QRS Duration:    QT Interval:    QTC Calculation:   R Axis:      Text Interpretation:           Imaging Studies ordered: I ordered imaging studies  including cxr I independently visualized the following imaging with scope of interpretation limited to determining acute life threatening conditions related to emergency care; findings noted above I independently visualized and interpreted imaging. I agree with the radiologist interpretation   Medicines ordered and prescription drug management: Meds ordered this encounter  Medications   cefTRIAXone  (ROCEPHIN ) injection 500 mg    Antibiotic Indication::   STD   lidocaine  (PF) (XYLOCAINE ) 1 % injection  1-2.1 mL   doxycycline  (VIBRA -TABS) 100 MG tablet    Sig: Take 1 tablet (100 mg total) by mouth 2 (two) times daily.    Dispense:  14 tablet    Refill:  0    -I have reviewed the patients home medicines and have made adjustments as needed   Consultations Obtained: na   Cardiac Monitoring: Continuous pulse oximetry interpreted by myself, 100% on RA.    Social Determinants of Health:  Diagnosis or treatment significantly limited by social determinants of health: current smoker and obesity Counseled patient for approximately 3 minutes regarding smoking cessation. Discussed risks of smoking and how they applied and affected their visit here today. Patient not ready to quit at this time, however will follow up with their primary doctor when they are.   CPT code: 00593: intermediate counseling for smoking cessation     Reevaluation: After the interventions noted above, I reevaluated the patient and found that they have stayed the same  Co morbidities that complicate the patient evaluation  Past Medical History:  Diagnosis Date   Anxiety    Bipolar disorder (HCC)    Cervical cancer (HCC) 2016   Cesarean delivery delivered 11/04/2019   Chlamydia    COVID    12/2021   Depression    GERD (gastroesophageal reflux disease)    Migraines    Trichomonas infection    Vaginal Pap smear, abnormal       Dispostion: Disposition decision including need for hospitalization was  considered, and patient discharged from emergency department.    Final Clinical Impression(s) / ED Diagnoses Final diagnoses:  Vaginal discharge  URI with cough and congestion        Elnor Jayson LABOR, DO 02/27/23 [id number]

## 2023-02-28 LAB — GC/CHLAMYDIA PROBE AMP (~~LOC~~) NOT AT ARMC
Chlamydia: NEGATIVE
Comment: NEGATIVE
Comment: NORMAL
Neisseria Gonorrhea: NEGATIVE

## 2023-03-03 ENCOUNTER — Other Ambulatory Visit (HOSPITAL_COMMUNITY): Payer: Self-pay | Admitting: Physician Assistant

## 2023-03-06 ENCOUNTER — Other Ambulatory Visit (HOSPITAL_COMMUNITY): Payer: Self-pay | Admitting: Physician Assistant

## 2023-03-06 DIAGNOSIS — F322 Major depressive disorder, single episode, severe without psychotic features: Secondary | ICD-10-CM

## 2023-03-06 DIAGNOSIS — F411 Generalized anxiety disorder: Secondary | ICD-10-CM

## 2023-03-30 ENCOUNTER — Encounter: Payer: Self-pay | Admitting: Nurse Practitioner

## 2023-03-30 ENCOUNTER — Telehealth: Admitting: Nurse Practitioner

## 2023-03-30 DIAGNOSIS — R112 Nausea with vomiting, unspecified: Secondary | ICD-10-CM

## 2023-03-30 MED ORDER — ONDANSETRON HCL 4 MG PO TABS: 4.0000 mg | ORAL_TABLET | Freq: Three times a day (TID) | ORAL | 0 refills | Status: DC | PRN

## 2023-03-30 NOTE — Progress Notes (Signed)
 I have spent 5 minutes in review of e-visit questionnaire, review and updating patient chart, medical decision making and response to patient.   Claiborne Rigg, NP

## 2023-03-30 NOTE — Progress Notes (Signed)
E-Visit for Nausea and Vomiting   We are sorry that you are not feeling well. Here is how we plan to help!  Based on what you have shared with me it looks like you have a Virus that is irritating your GI tract.  Vomiting is the forceful emptying of a portion of the stomach's content through the mouth.  Although nausea and vomiting can make you feel miserable, it's important to remember that these are not diseases, but rather symptoms of an underlying illness.  When we treat short term symptoms, we always caution that any symptoms that persist should be fully evaluated in a medical office.  I have prescribed a medication that will help alleviate your symptoms and allow you to stay hydrated:  Zofran 4 mg 1 tablet every 8 hours as needed for nausea and vomiting  HOME CARE: Drink clear liquids.  This is very important! Dehydration (the lack of fluid) can lead to a serious complication.  Start off with 1 tablespoon every 5 minutes for 8 hours. You may begin eating bland foods after 8 hours without vomiting.  Start with saltine crackers, white bread, rice, mashed potatoes, applesauce. After 48 hours on a bland diet, you may resume a normal diet. Try to go to sleep.  Sleep often empties the stomach and relieves the need to vomit.  GET HELP RIGHT AWAY IF:  Your symptoms do not improve or worsen within 2 days after treatment. You have a fever for over 3 days. You cannot keep down fluids after trying the medication.  MAKE SURE YOU:  Understand these instructions. Will watch your condition. Will get help right away if you are not doing well or get worse.    Thank you for choosing an e-visit.  Your e-visit answers were reviewed by a board certified advanced clinical practitioner to complete your personal care plan. Depending upon the condition, your plan could have included both over the counter or prescription medications.  Please review your pharmacy choice. Make sure the pharmacy is open so  you can pick up prescription now. If there is a problem, you may contact your provider through MyChart messaging and have the prescription routed to another pharmacy.  Your safety is important to us. If you have drug allergies check your prescription carefully.   For the next 24 hours you can use MyChart to ask questions about today's visit, request a non-urgent call back, or ask for a work or school excuse. You will get an email in the next two days asking about your experience. I hope that your e-visit has been valuable and will speed your recovery.  

## 2023-04-20 ENCOUNTER — Other Ambulatory Visit (HOSPITAL_COMMUNITY): Payer: Self-pay | Admitting: Physician Assistant

## 2023-04-20 DIAGNOSIS — F411 Generalized anxiety disorder: Secondary | ICD-10-CM

## 2023-04-20 DIAGNOSIS — F322 Major depressive disorder, single episode, severe without psychotic features: Secondary | ICD-10-CM

## 2023-04-26 ENCOUNTER — Telehealth: Admitting: Physician Assistant

## 2023-04-26 DIAGNOSIS — R197 Diarrhea, unspecified: Secondary | ICD-10-CM

## 2023-04-26 NOTE — Progress Notes (Signed)
 E-Visit for Diarrhea  We are sorry that you are not feeling well.  Here is how we plan to help!  Based on what you have shared with me it looks like you have Acute Infectious Diarrhea.  Most cases of acute diarrhea are due to infections with virus and bacteria and are self-limited conditions lasting less than 14 days.  For your symptoms you may take Imodium 2 mg tablets that are over the counter at your local pharmacy. Take two tablet now and then one after each loose stool up to 6 a day.  Antibiotics are not needed for most people with diarrhea.  A work note has been provided for you today. It will be available under "letters" in your MyChart account.   HOME CARE We recommend changing your diet to help with your symptoms for the next few days. Drink plenty of fluids that contain water salt and sugar. Sports drinks such as Gatorade may help.  You may try broths, soups, bananas, applesauce, soft breads, mashed potatoes or crackers.  You are considered infectious for as long as the diarrhea continues. Hand washing or use of alcohol based hand sanitizers is recommend. It is best to stay out of work or school until your symptoms stop.   GET HELP RIGHT AWAY If you have dark yellow colored urine or do not pass urine frequently you should drink more fluids.   If your symptoms worsen  If you feel like you are going to pass out (faint) You have a new problem  MAKE SURE YOU  Understand these instructions. Will watch your condition. Will get help right away if you are not doing well or get worse.  Thank you for choosing an e-visit.  Your e-visit answers were reviewed by a board certified advanced clinical practitioner to complete your personal care plan. Depending upon the condition, your plan could have included both over the counter or prescription medications.  Please review your pharmacy choice. Make sure the pharmacy is open so you can pick up prescription now. If there is a problem,  you may contact your provider through [employer] and have the prescription routed to another pharmacy.  Your safety is important to [place]. If you have drug allergies check your prescription carefully.   For the next 24 hours you can use MyChart to ask questions about today's visit, request a non-urgent call back, or ask for a work or school excuse. You will get an email in the next two days asking about your experience. I hope that your e-visit has been valuable and will speed your recovery.   I have spent 5 minutes in review of e-visit questionnaire, review and updating patient chart, medical decision making and response to patient.   Margaretann Loveless, PA-C

## 2023-06-20 ENCOUNTER — Encounter: Payer: Self-pay | Admitting: Podiatry

## 2023-06-20 ENCOUNTER — Ambulatory Visit

## 2023-06-20 ENCOUNTER — Ambulatory Visit (INDEPENDENT_AMBULATORY_CARE_PROVIDER_SITE_OTHER): Admitting: Podiatry

## 2023-06-20 ENCOUNTER — Ambulatory Visit (INDEPENDENT_AMBULATORY_CARE_PROVIDER_SITE_OTHER)

## 2023-06-20 DIAGNOSIS — S90112A Contusion of left great toe without damage to nail, initial encounter: Secondary | ICD-10-CM | POA: Diagnosis not present

## 2023-06-20 DIAGNOSIS — S92425A Nondisplaced fracture of distal phalanx of left great toe, initial encounter for closed fracture: Secondary | ICD-10-CM

## 2023-06-20 NOTE — Progress Notes (Signed)
 Chief Complaint  Patient presents with   Toe Pain    Pt. Dropped wine bottle on left foot great toe x 2 weeks. 8 pain. X rays done at [facility]. Tylenol  for pain.    HPI: 38 y.o. female presents today with left first toe pain.  She states that on May 15 a wine bottle fell on the toe.  She did seek medical attention for this, received x-rays and was told she may have sustained a fracture.  She presents ambulating wearing slippers today.  Endorses continued pain and swelling to the area.  She reports that she has been taking Tylenol .  Past Medical History:  Diagnosis Date   Anxiety    Bipolar disorder (HCC)    Cervical cancer (HCC) 2016   Cesarean delivery delivered 11/04/2019   Chlamydia    COVID    12/2021   Depression    GERD (gastroesophageal reflux disease)    Migraines    Trichomonas infection    Vaginal Pap smear, abnormal     Past Surgical History:  Procedure Laterality Date   CERVICAL CONIZATION W/BX N/A 10/16/2022   Procedure: CONIZATION CERVIX WITH BIOPSY;  Surgeon: Othelia Blinks, MD;  Location: [facility] OR;  Service: Gynecology;  Laterality: N/A;   CESAREAN SECTION     CESAREAN SECTION     CESAREAN SECTION N/A 11/04/2019   Procedure: CESAREAN SECTION;  Surgeon: Verlyn Goad, MD;  Location: [facility] LD ORS;  Service: Obstetrics;  Laterality: N/A;   COLPOSCOPY     IRRIGATION AND DEBRIDEMENT SEBACEOUS CYST  06/05/2022   Procedure: IRRIGATION AND DEBRIDEMENT LABIAL SEBACEOUS CYSTS;  Surgeon: Othelia Blinks, MD;  Location: [facility] OR;  Service: Gynecology;;   TUBAL LIGATION      Allergies  Allergen Reactions   Ibuprofen  Nausea And Vomiting   Citalopram  Other (See Comments)    Felt like a zombie    Zoloft [Sertraline] Other (See Comments)    Felt like a zombie     ROS    Physical Exam: There were no vitals filed for this visit.  General: The patient is alert and oriented x3 in no acute distress.  Dermatology: Skin is warm, dry and supple bilateral lower  extremities. Interspaces are clear of maceration and debris.  No hematoma noted to the left first nail plate.  Vascular: Palpable pedal pulses bilaterally. Capillary refill within normal limits.  No appreciable diffuse edema.  No erythema or calor.  Neurological: Light touch sensation grossly intact bilateral feet.   Musculoskeletal Exam: No pedal deformities noted.  Pain on palpation of left hallux.  No significant bruising at this point.  Radiographic Exam: Left foot 3 views weightbearing 06/20/2023 Normal osseous mineralization. Joint spaces preserved.  Nondisplaced longitudinal fracture of distal phalanx of first toe is appreciated without articular involvement.  Possibly some mild comminution with some fragmentation noted at the lateral condyle of proximal phalanx.  Assessment/Plan of Care: 1. Nondisplaced fracture of distal phalanx of left great toe, initial encounter for closed fracture      Meds ordered this encounter  Medications   oxyCODONE  (ROXICODONE ) 5 MG immediate release tablet    Sig: Take 1 tablet (5 mg total) by mouth every 4 (four) hours as needed for up to 20 doses for severe pain (pain score 7-10).    Dispense:  20 tablet    Refill:  0   DG FOOT COMPLETE LEFT  Discussed clinical findings with patient today.  Radiographs reviewed with patient  Discussed findings of minimally displaced distal phalanx fracture.  This can be managed conservatively.  Surgical shoe dispensed with patient to be worn at all times weightbearing.  Discussed bone healing timeline with patient that this injury will likely take 6 to 8 weeks to heal radiographically with immobilization.  She may otherwise weight-bear in the shoe.  Short course of oxycodone  prescribed for severe acute pain.  She may continue Tylenol  as needed.  Discussed RICE therapy as well.  Follow-up in 4 weeks for repeat radiographs.  Charleton Deyoung L. Lunda Salines, AACFAS [facility]     [address] [place], [place] [address]                Office [phone]  Fax [phone]

## 2023-06-21 MED ORDER — OXYCODONE HCL 5 MG PO TABS: 5.0000 mg | ORAL_TABLET | ORAL | 0 refills | Status: DC | PRN

## 2023-06-22 ENCOUNTER — Encounter: Payer: Self-pay | Admitting: Podiatry

## 2023-07-18 ENCOUNTER — Ambulatory Visit: Admitting: Podiatry

## 2023-07-25 ENCOUNTER — Telehealth (HOSPITAL_COMMUNITY): Admitting: Physician Assistant

## 2023-07-25 ENCOUNTER — Encounter (HOSPITAL_COMMUNITY): Payer: Self-pay

## 2023-08-09 ENCOUNTER — Ambulatory Visit: Admitting: Podiatry

## 2023-08-09 ENCOUNTER — Encounter: Payer: Self-pay | Admitting: Podiatry

## 2023-08-09 DIAGNOSIS — Z91199 Patient's noncompliance with other medical treatment and regimen due to unspecified reason: Secondary | ICD-10-CM

## 2023-08-09 NOTE — Progress Notes (Signed)
Patient did not show for scheduled appointment today.

## 2023-08-25 ENCOUNTER — Telehealth

## 2023-08-25 DIAGNOSIS — T3695XA Adverse effect of unspecified systemic antibiotic, initial encounter: Secondary | ICD-10-CM

## 2023-08-25 DIAGNOSIS — B379 Candidiasis, unspecified: Secondary | ICD-10-CM

## 2023-08-26 MED ORDER — FLUCONAZOLE 150 MG PO TABS: 150.0000 mg | ORAL_TABLET | ORAL | 0 refills | Status: AC | PRN

## 2023-08-26 NOTE — Progress Notes (Signed)

## 2023-09-15 ENCOUNTER — Telehealth: Admitting: Family

## 2023-09-15 DIAGNOSIS — R197 Diarrhea, unspecified: Secondary | ICD-10-CM

## 2023-09-15 MED ORDER — ONDANSETRON HCL 4 MG PO TABS: 4.0000 mg | ORAL_TABLET | Freq: Three times a day (TID) | ORAL | 0 refills | Status: AC | PRN

## 2023-09-15 NOTE — Progress Notes (Signed)
 E-Visit for Diarrhea  We are sorry that you are not feeling well.  Here is how we plan to help!  Based on what you have shared with me it looks like you have Acute Infectious Diarrhea.  Most cases of acute diarrhea are due to infections with virus and bacteria and are self-limited conditions lasting less than 14 days.  For your symptoms you may take Imodium 2 mg tablets that are over the counter at your local pharmacy. Take two tablet now and then one after each loose stool up to 6 a day.  Antibiotics are not needed for most people with diarrhea.  Zofran 4 mg 1 tablet every 8 hours as needed for nausea and vomiting   HOME CARE We recommend changing your diet to help with your symptoms for the next few days. Drink plenty of fluids that contain water salt and sugar. Sports drinks such as Gatorade may help.  You may try broths, soups, bananas, applesauce, soft breads, mashed potatoes or crackers.  You are considered infectious for as long as the diarrhea continues. Hand washing or use of alcohol based hand sanitizers is recommend. It is best to stay out of work or school until your symptoms stop.   GET HELP RIGHT AWAY If you have dark yellow colored urine or do not pass urine frequently you should drink more fluids.   If your symptoms worsen  If you feel like you are going to pass out (faint) You have a new problem  MAKE SURE YOU  Understand these instructions. Will watch your condition. Will get help right away if you are not doing well or get worse.  Thank you for choosing an e-visit.  Your e-visit answers were reviewed by a board certified advanced clinical practitioner to complete your personal care plan. Depending upon the condition, your plan could have included both over the counter or prescription medications.  Please review your pharmacy choice. Make sure the pharmacy is open so you can pick up prescription now. If there is a problem, you may contact your provider through  [employer] and have the prescription routed to another pharmacy.  Your safety is important to [place]. If you have drug allergies check your prescription carefully.   For the next 24 hours you can use MyChart to ask questions about today's visit, request a non-urgent call back, or ask for a work or school excuse. You will get an email in the next two days asking about your experience. I hope that your e-visit has been valuable and will speed your recovery.  Approximately 5 minutes was spent documenting and reviewing patient's chart.

## 2023-10-04 ENCOUNTER — Other Ambulatory Visit: Payer: Self-pay | Admitting: Medical Genetics

## 2023-10-05 ENCOUNTER — Telehealth: Admitting: Nurse Practitioner

## 2023-10-09 ENCOUNTER — Telehealth: Admitting: Physician Assistant

## 2023-10-09 DIAGNOSIS — R42 Dizziness and giddiness: Secondary | ICD-10-CM

## 2023-10-09 DIAGNOSIS — R55 Syncope and collapse: Secondary | ICD-10-CM

## 2023-10-09 NOTE — Progress Notes (Signed)
  Because of the symptoms mentioned, I feel your condition warrants further evaluation and I recommend that you be seen in a face-to-face visit for lab work to see if you are having an acute anemia, electrolyte abnormality, or other cause.   NOTE: There will be NO CHARGE for this E-Visit   If you are having a true medical emergency, please call 911.     For an urgent face to face visit, [facility] has multiple urgent care centers for your convenience.  Click the link below for the full list of locations and hours, walk-in wait times, appointment scheduling options and driving directions:  Urgent Care - [place], [place], [place], [place], [place], [place]  [place]     Your MyChart E-visit questionnaire answers were reviewed by a board certified advanced clinical practitioner to complete your personal care plan based on your specific symptoms.    Thank you for using e-Visits.     I have spent 5 minutes in review of e-visit questionnaire, review and updating patient chart, medical decision making and response to patient.   Delon CHRISTELLA Dickinson, PA-C

## 2023-12-18 ENCOUNTER — Other Ambulatory Visit (HOSPITAL_COMMUNITY)

## 2024-02-04 ENCOUNTER — Other Ambulatory Visit

## 2024-02-06 ENCOUNTER — Telehealth: Admitting: Physician Assistant

## 2024-02-06 DIAGNOSIS — A084 Viral intestinal infection, unspecified: Secondary | ICD-10-CM

## 2024-02-06 MED ORDER — ONDANSETRON 4 MG PO TBDP: 4.0000 mg | ORAL_TABLET | Freq: Three times a day (TID) | ORAL | 0 refills | Status: AC | PRN

## 2024-02-06 NOTE — Progress Notes (Signed)
 We are sorry that you are not feeling well.  Here is how we plan to help!  Based on what you have shared with me it looks like you have Acute Infectious Diarrhea.  Most cases of acute diarrhea are due to infections with virus and bacteria and are self-limited conditions lasting less than 14 days.  For your symptoms you may take Imodium 2 mg tablets that are over the counter at your local pharmacy. Take two tablet now and then one after each loose stool up to 6 a day.  Antibiotics are not needed for most people with diarrhea.  I have prescribed: Zofran  4 mg 1 tablet every 8 hours as needed for nausea and vomiting  HOME CARE We recommend changing your diet to help with your symptoms for the next few days. Drink plenty of fluids that contain water  salt and sugar. Sports drinks such as Gatorade may help.  You may try broths, soups, bananas, applesauce, soft breads, mashed potatoes or crackers.  You are considered infectious for as long as the diarrhea continues. Hand washing or use of alcohol based hand sanitizers is recommend. It is best to stay out of work or school until your symptoms stop.   GET HELP RIGHT AWAY If you have dark yellow colored urine or do not pass urine frequently you should drink more fluids.   If your symptoms worsen  If you feel like you are going to pass out (faint) You have a new problem  MAKE SURE YOU  Understand these instructions. Will watch your condition. Will get help right away if you are not doing well or get worse.  Your e-visit answers were reviewed by a board certified advanced clinical practitioner to complete your personal care plan.  Depending on the condition, your plan could have included both over the counter or prescription medications.  If there is a problem please reply  once you have received a response from your provider.  Your safety is important to us .  If you have drug allergies check your prescription carefully.    You can use MyChart  to ask questions about todays visit, request a non-urgent call back, or ask for a work or school excuse for 24 hours related to this e-Visit. If it has been greater than 24 hours you will need to follow up with your provider, or enter a new e-Visit to address those concerns.   You will get an e-mail in the next two days asking about your experience.  I hope that your e-visit has been valuable and will speed your recovery. Thank you for using e-visits.   I have spent 5 minutes in review of e-visit questionnaire, review and updating patient chart, medical decision making and response to patient.   Delon CHRISTELLA Dickinson, PA-C
